# Patient Record
Sex: Female | Born: 1935 | Race: White | Hispanic: No
Health system: Southern US, Community
[De-identification: ages and names within clinical notes are randomized; demographics above are authoritative.]

## PROBLEM LIST (undated history)

## (undated) DIAGNOSIS — I4891 Unspecified atrial fibrillation: Secondary | ICD-10-CM

## (undated) DIAGNOSIS — I10 Essential (primary) hypertension: Secondary | ICD-10-CM

## (undated) DIAGNOSIS — G459 Transient cerebral ischemic attack, unspecified: Secondary | ICD-10-CM

## (undated) HISTORY — PX: ABDOMINAL HYSTERECTOMY: SHX81

## (undated) HISTORY — PX: APPENDECTOMY: SHX54

---

## 2015-09-03 ENCOUNTER — Observation Stay (HOSPITAL_COMMUNITY)
Admission: EM | Admit: 2015-09-03 | Discharge: 2015-09-05 | Disposition: A | Discharge status: Home / Self Care | Payer: Medicare Other | Attending: Internal Medicine | Admitting: Internal Medicine

## 2015-09-03 ENCOUNTER — Encounter (HOSPITAL_COMMUNITY): Payer: Self-pay

## 2015-09-03 ENCOUNTER — Inpatient Hospital Stay (HOSPITAL_COMMUNITY): Payer: Medicare Other

## 2015-09-03 ENCOUNTER — Observation Stay (HOSPITAL_COMMUNITY): Payer: Medicare Other

## 2015-09-03 ENCOUNTER — Emergency Department (HOSPITAL_COMMUNITY): Payer: Medicare Other

## 2015-09-03 DIAGNOSIS — H538 Other visual disturbances: Secondary | ICD-10-CM | POA: Insufficient documentation

## 2015-09-03 DIAGNOSIS — R410 Disorientation, unspecified: Secondary | ICD-10-CM | POA: Insufficient documentation

## 2015-09-03 DIAGNOSIS — I10 Essential (primary) hypertension: Secondary | ICD-10-CM | POA: Diagnosis present

## 2015-09-03 DIAGNOSIS — F40240 Claustrophobia: Secondary | ICD-10-CM | POA: Insufficient documentation

## 2015-09-03 DIAGNOSIS — I4891 Unspecified atrial fibrillation: Secondary | ICD-10-CM | POA: Diagnosis present

## 2015-09-03 DIAGNOSIS — Z7901 Long term (current) use of anticoagulants: Secondary | ICD-10-CM | POA: Insufficient documentation

## 2015-09-03 DIAGNOSIS — G459 Transient cerebral ischemic attack, unspecified: Principal | ICD-10-CM | POA: Diagnosis present

## 2015-09-03 DIAGNOSIS — E039 Hypothyroidism, unspecified: Secondary | ICD-10-CM | POA: Diagnosis present

## 2015-09-03 DIAGNOSIS — E785 Hyperlipidemia, unspecified: Secondary | ICD-10-CM | POA: Insufficient documentation

## 2015-09-03 DIAGNOSIS — Z7982 Long term (current) use of aspirin: Secondary | ICD-10-CM | POA: Insufficient documentation

## 2015-09-03 DIAGNOSIS — G451 Carotid artery syndrome (hemispheric): Secondary | ICD-10-CM | POA: Diagnosis not present

## 2015-09-03 HISTORY — DX: Unspecified atrial fibrillation: I48.91

## 2015-09-03 LAB — URINALYSIS, ROUTINE W REFLEX MICROSCOPIC
BILIRUBIN URINE: NEGATIVE
Glucose, UA: NEGATIVE mg/dL
Hgb urine dipstick: NEGATIVE
KETONES UR: 15 mg/dL — AB
NITRITE: NEGATIVE
PH: 6.5 (ref 5.0–8.0)
Protein, ur: NEGATIVE mg/dL
Specific Gravity, Urine: 1.009 (ref 1.005–1.030)
UROBILINOGEN UA: 0.2 mg/dL (ref 0.0–1.0)

## 2015-09-03 LAB — CBC WITH DIFFERENTIAL/PLATELET
BASOS ABS: 0 10*3/uL (ref 0.0–0.1)
Basophils Relative: 0 %
Eosinophils Absolute: 0.1 10*3/uL (ref 0.0–0.7)
Eosinophils Relative: 1 %
HEMATOCRIT: 41.2 % (ref 36.0–46.0)
Hemoglobin: 13.9 g/dL (ref 12.0–15.0)
LYMPHS ABS: 2.3 10*3/uL (ref 0.7–4.0)
LYMPHS PCT: 27 %
MCH: 32.4 pg (ref 26.0–34.0)
MCHC: 33.7 g/dL (ref 30.0–36.0)
MCV: 96 fL (ref 78.0–100.0)
MONO ABS: 0.8 10*3/uL (ref 0.1–1.0)
Monocytes Relative: 9 %
NEUTROS ABS: 5.3 10*3/uL (ref 1.7–7.7)
Neutrophils Relative %: 63 %
Platelets: 289 10*3/uL (ref 150–400)
RBC: 4.29 MIL/uL (ref 3.87–5.11)
RDW: 12.6 % (ref 11.5–15.5)
WBC: 8.5 10*3/uL (ref 4.0–10.5)

## 2015-09-03 LAB — RAPID URINE DRUG SCREEN, HOSP PERFORMED
Amphetamines: NOT DETECTED
BARBITURATES: NOT DETECTED
Benzodiazepines: NOT DETECTED
COCAINE: NOT DETECTED
OPIATES: NOT DETECTED
Tetrahydrocannabinol: NOT DETECTED

## 2015-09-03 LAB — BASIC METABOLIC PANEL
ANION GAP: 9 (ref 5–15)
BUN: 10 mg/dL (ref 6–20)
CO2: 25 mmol/L (ref 22–32)
Calcium: 9.7 mg/dL (ref 8.9–10.3)
Chloride: 104 mmol/L (ref 101–111)
Creatinine, Ser: 0.7 mg/dL (ref 0.44–1.00)
GFR calc Af Amer: >60 mL/min (ref >60)
GFR calc non Af Amer: >60 mL/min (ref >60)
GLUCOSE: 103 mg/dL — AB (ref 65–99)
POTASSIUM: 3.7 mmol/L (ref 3.5–5.1)
Sodium: 138 mmol/L (ref 135–145)

## 2015-09-03 LAB — APTT: APTT: 30 s (ref 24–37)

## 2015-09-03 LAB — PROTIME-INR
INR: 1.03 (ref 0.00–1.49)
Prothrombin Time: 13.7 seconds (ref 11.6–15.2)

## 2015-09-03 LAB — URINE MICROSCOPIC-ADD ON

## 2015-09-03 LAB — DIGOXIN LEVEL: DIGOXIN LVL: 0.7 ng/mL — AB (ref 0.8–2.0)

## 2015-09-03 MED ORDER — MAGNESIUM SULFATE 2 GM/50ML IV SOLN
2.0000 g | Freq: Once | INTRAVENOUS | Status: AC
  Administered 2015-09-04: 2 g via INTRAVENOUS
  Filled 2015-09-03: qty 50

## 2015-09-03 MED ORDER — ZOLPIDEM TARTRATE 5 MG PO TABS
2.5000 mg | ORAL_TABLET | Freq: Every day | ORAL | Status: DC
  Administered 2015-09-04 (×2): 2.5 mg via ORAL
  Filled 2015-09-03 (×2): qty 1

## 2015-09-03 MED ORDER — RIVAROXABAN 15 MG PO TABS
15.0000 mg | ORAL_TABLET | Freq: Every day | ORAL | Status: DC
  Administered 2015-09-03: 15 mg via ORAL
  Filled 2015-09-03 (×2): qty 1

## 2015-09-03 MED ORDER — GLUCOSAMINE-CHONDROITIN 500-400 MG PO TABS: 1.0000 | ORAL_TABLET | Freq: Three times a day (TID) | ORAL | Status: DC

## 2015-09-03 MED ORDER — OMEGA-3-ACID ETHYL ESTERS 1 G PO CAPS
1.0000 g | ORAL_CAPSULE | Freq: Two times a day (BID) | ORAL | Status: DC
  Administered 2015-09-04 – 2015-09-05 (×3): 1 g via ORAL
  Filled 2015-09-03 (×3): qty 1

## 2015-09-03 MED ORDER — IPRATROPIUM BROMIDE 0.06 % NA SOLN
2.0000 | Freq: Two times a day (BID) | NASAL | Status: DC
  Administered 2015-09-04 (×2): 2 via NASAL
  Filled 2015-09-03: qty 15

## 2015-09-03 MED ORDER — STROKE: EARLY STAGES OF RECOVERY BOOK
Freq: Once | Status: AC
  Administered 2015-09-04

## 2015-09-03 MED ORDER — POTASSIUM CHLORIDE CRYS ER 20 MEQ PO TBCR
20.0000 meq | EXTENDED_RELEASE_TABLET | Freq: Once | ORAL | Status: AC
  Administered 2015-09-04: 20 meq via ORAL
  Filled 2015-09-03: qty 1

## 2015-09-03 MED ORDER — LORAZEPAM 2 MG/ML IJ SOLN
1.0000 mg | Freq: Once | INTRAMUSCULAR | Status: DC
  Filled 2015-09-03: qty 1

## 2015-09-03 MED ORDER — DIGOXIN 125 MCG PO TABS
0.1250 mg | ORAL_TABLET | Freq: Every day | ORAL | Status: DC
  Administered 2015-09-04 – 2015-09-05 (×2): 0.125 mg via ORAL
  Filled 2015-09-03 (×2): qty 1

## 2015-09-03 MED ORDER — METOPROLOL TARTRATE 25 MG PO TABS
37.5000 mg | ORAL_TABLET | Freq: Two times a day (BID) | ORAL | Status: DC
  Administered 2015-09-04 – 2015-09-05 (×3): 37.5 mg via ORAL
  Filled 2015-09-03 (×6): qty 1

## 2015-09-03 MED ORDER — METOPROLOL TARTRATE 25 MG PO TABS
37.5000 mg | ORAL_TABLET | Freq: Once | ORAL | Status: AC
  Administered 2015-09-03: 37.5 mg via ORAL
  Filled 2015-09-03: qty 2

## 2015-09-03 MED ORDER — LEVOTHYROXINE SODIUM 50 MCG PO TABS
75.0000 ug | ORAL_TABLET | Freq: Every day | ORAL | Status: DC
  Administered 2015-09-04 – 2015-09-05 (×2): 75 ug via ORAL
  Filled 2015-09-03 (×6): qty 1

## 2015-09-03 NOTE — Consult Note (Addendum)
Referring Physician: ED    Chief Complaint: transient visual disturbances, confusion, dysphasia  HPI:                                                                                                                                         Gabriella Shaw is an 81 y.o. female, right handed, very functional individual who plays gold regularly, with a past medical history that is significant for HTN, chronic atrial fibrillation, brought in by EMS for evaluation of the aforementioned symptoms. Patient lives at an assisted living facility and said that she never had similar symptoms before. Stated that before lunch time she was reading and suddenly her vision became so blurry that she couldn't read words, "it was like words on the pages weren't there or the pages were blank ".  Gabriella Shaw indicated that she then went to the clinic at [facility] where she reports she was confuse, unable to name the president or recall her son's name, and was unable to get her words out. The whole episode lasted for approximately one hour and resolved. No associated HA, vertigo, double vision, focal weakness or numbness. CT brain performed in the ED was personally reviewed and showed no acute abnormality. A fib documented in the ED. Stated that she has been taking aspirin 81 mg daily. Of note, she reports an episode of transient painless visual loss left eye approximately 5 or 6 years ago.   Date last known well: 09/03/15 Time last known well: noon time  tPA Given: no, symptoms resolved   Past Medical History  Diagnosis Date  . A-fib     Past Surgical History  Procedure Laterality Date  . Abdominal hysterectomy    . Appendectomy      No family history on file. Social History:  does not have a smoking history on file. She has quit using smokeless tobacco. She reports that she drinks alcohol. She reports that she does not use illicit drugs. Family history: no MS, epilepsy, brain tumor, or brain  aneurysm. Allergies: No Known Allergies  Medications:                                                                                                                           I have reviewed the patient's current medications.  ROS:  History obtained from chart review and the patient  General ROS: negative for - chills, fatigue, fever, night sweats, weight gain or weight loss Psychological ROS: negative for - behavioral disorder, hallucinations, memory difficulties, mood swings or suicidal ideation Ophthalmic ROS: negative for - blurry vision, double vision, eye pain or loss of vision ENT ROS: negative for - epistaxis, nasal discharge, oral lesions, sore throat, tinnitus or vertigo Allergy and Immunology ROS: negative for - hives or itchy/watery eyes Hematological and Lymphatic ROS: negative for - bleeding problems, bruising or swollen lymph nodes Endocrine ROS: negative for - galactorrhea, hair pattern changes, polydipsia/polyuria or temperature intolerance Respiratory ROS: negative for - cough, hemoptysis, shortness of breath or wheezing Cardiovascular ROS: negative for - chest pain, dyspnea on exertion, or edema Gastrointestinal ROS: negative for - abdominal pain, diarrhea, hematemesis, nausea/vomiting or stool incontinence Genito-Urinary ROS: negative for - dysuria, hematuria, incontinence or urinary frequency/urgency Musculoskeletal ROS: negative for - joint swelling or muscular weakness Neurological ROS: as noted in HPI Dermatological ROS: negative for rash and skin lesion changes   Physical exam:  Constitutional: well developed, pleasant female in no apparent distress. Blood pressure 153/69, pulse 61, temperature 97.8 F (36.6 C), temperature source Oral, resp. rate 25, height 5' 6.5" (1.689 m), weight 58.968 kg (130 lb), SpO2 96 %. Eyes: no jaundice  or exophthalmos.  Head: normocephalic. Neck: supple, no bruits, no JVD. Cardiac: no murmurs. Lungs: clear. Abdomen: soft, no tender, no mass. Extremities: no edema, clubbing, or cyanosis.  Skin: no rash  Neurologic Examination:                                                                                                      General: Mental Status: Alert, oriented, thought content appropriate.  Speech fluent without evidence of aphasia.  Able to follow 3 step commands without difficulty. Cranial Nerves: II: Discs flat bilaterally; Visual fields grossly normal, pupils equal, round, reactive to light and accommodation III,IV, VI: ptosis not present, extra-ocular motions intact bilaterally V,VII: smile symmetric, facial light touch sensation normal bilaterally VIII: hearing normal bilaterally IX,X: uvula rises symmetrically XI: bilateral shoulder shrug XII: midline tongue extension without atrophy or fasciculations  Motor: Right : Upper extremity   5/5    Left:     Upper extremity   5/5  Lower extremity   5/5     Lower extremity   5/5 Tone and bulk:normal tone throughout; no atrophy noted Sensory: Pinprick and light touch intact throughout, bilaterally Deep Tendon Reflexes:  1+ all over Plantars: Right: downgoing   Left: downgoing Cerebellar: normal finger-to-nose,  normal heel-to-shin test Gait:  No tested due to multiple leads    Results for orders placed or performed during the hospital encounter of 09/03/15 (from the past 48 hour(s))  Urinalysis, Routine w reflex microscopic (not at [facility] [address]     Status: Abnormal   Collection Time: 09/03/15  3:00 PM  Result Value Ref Range   Color, Urine YELLOW YELLOW   APPearance CLOUDY (A) CLEAR   Specific Gravity, Urine 1.009 1.005 - 1.030   pH 6.5 5.0 -  8.0   Glucose, UA NEGATIVE NEGATIVE mg/dL   Hgb urine dipstick NEGATIVE NEGATIVE   Bilirubin Urine NEGATIVE NEGATIVE   Ketones, ur 15 (A) NEGATIVE mg/dL   Protein, ur NEGATIVE  NEGATIVE mg/dL   Urobilinogen, UA 0.2 0.0 - 1.0 mg/dL   Nitrite NEGATIVE NEGATIVE   Leukocytes, UA TRACE (A) NEGATIVE  Urine rapid drug screen (hosp performed)not at [facility]     Status: None   Collection Time: 09/03/15  3:00 PM  Result Value Ref Range   Opiates NONE DETECTED NONE DETECTED   Cocaine NONE DETECTED NONE DETECTED   Benzodiazepines NONE DETECTED NONE DETECTED   Amphetamines NONE DETECTED NONE DETECTED   Tetrahydrocannabinol NONE DETECTED NONE DETECTED   Barbiturates NONE DETECTED NONE DETECTED    Comment:        DRUG SCREEN FOR MEDICAL PURPOSES ONLY.  IF CONFIRMATION IS NEEDED FOR ANY PURPOSE, NOTIFY LAB WITHIN 5 DAYS.        LOWEST DETECTABLE LIMITS FOR URINE DRUG SCREEN Drug Class       Cutoff (ng/mL) Amphetamine      1000 Barbiturate      200 Benzodiazepine   191 Tricyclics       478 Opiates          300 Cocaine          300 THC              50   Urine microscopic-add on     Status: None   Collection Time: 09/03/15  3:00 PM  Result Value Ref Range   Squamous Epithelial / LPF RARE RARE   WBC, UA 0-2 <3 WBC/hpf   RBC / HPF 0-2 <3 RBC/hpf  CBC with Differential     Status: None   Collection Time: 09/03/15  4:43 PM  Result Value Ref Range   WBC 8.5 4.0 - 10.5 K/uL   RBC 4.29 3.87 - 5.11 MIL/uL   Hemoglobin 13.9 12.0 - 15.0 g/dL   HCT 41.2 36.0 - 46.0 %   MCV 96.0 78.0 - 100.0 fL   MCH 32.4 26.0 - 34.0 pg   MCHC 33.7 30.0 - 36.0 g/dL   RDW 12.6 11.5 - 15.5 %   Platelets 289 150 - 400 K/uL   Neutrophils Relative % 63 %   Neutro Abs 5.3 1.7 - 7.7 K/uL   Lymphocytes Relative 27 %   Lymphs Abs 2.3 0.7 - 4.0 K/uL   Monocytes Relative 9 %   Monocytes Absolute 0.8 0.1 - 1.0 K/uL   Eosinophils Relative 1 %   Eosinophils Absolute 0.1 0.0 - 0.7 K/uL   Basophils Relative 0 %   Basophils Absolute 0.0 0.0 - 0.1 K/uL  Basic metabolic panel     Status: Abnormal   Collection Time: 09/03/15  4:43 PM  Result Value Ref Range   Sodium 138 135 - 145 mmol/L   Potassium  3.7 3.5 - 5.1 mmol/L   Chloride 104 101 - 111 mmol/L   CO2 25 22 - 32 mmol/L   Glucose, Bld 103 (H) 65 - 99 mg/dL   BUN 10 6 - 20 mg/dL   Creatinine, Ser 0.70 0.44 - 1.00 mg/dL   Calcium 9.7 8.9 - 10.3 mg/dL   GFR calc non Af Amer >60 >60 mL/min   GFR calc Af Amer >60 >60 mL/min    Comment: (NOTE) The eGFR has been calculated using the CKD EPI equation. This calculation has not been validated in all clinical situations. eGFR's persistently <  60 mL/min signify possible Chronic Kidney Disease.    Anion gap 9 5 - 15  Protime-INR     Status: None   Collection Time: 09/03/15  4:43 PM  Result Value Ref Range   Prothrombin Time 13.7 11.6 - 15.2 seconds   INR 1.03 0.00 - 1.49  APTT     Status: None   Collection Time: 09/03/15  4:43 PM  Result Value Ref Range   aPTT 30 24 - 37 seconds  Digoxin level     Status: Abnormal   Collection Time: 09/03/15  4:43 PM  Result Value Ref Range   Digoxin Level 0.7 (L) 0.8 - 2.0 ng/mL   Ct Head Wo Contrast  09/03/2015   CLINICAL DATA:  Acute dizziness and blurred vision today.  EXAM: CT HEAD WITHOUT CONTRAST  TECHNIQUE: Contiguous axial images were obtained from the base of the skull through the vertex without intravenous contrast.  COMPARISON:  None.  FINDINGS: Mild generalized cerebral volume loss is noted.  No acute intracranial abnormalities are identified, including mass lesion or mass effect, hydrocephalus, extra-axial fluid collection, midline shift, hemorrhage, or acute infarction.  The visualized bony calvarium is unremarkable.  IMPRESSION: No evidence of acute intracranial abnormality.  Mild atrophy.   Electronically Signed   By: Margarette Canada M.D.   On: 09/03/2015 18:31    Assessment: 81 y.o. female with HTN, atrial fibrillation, comes in a constellation of symptoms suggestive of TIA. ABCD2 score 5. Current CHA2DS-VASc score is now 6, which confers a yearly stroke risk of 9.8%. Patient is very functional and quite frankly seems to be a suitable  candidate for starting a NOAC. Recommend stopping aspirin. Complete TIA work up. Stroke team will follow up tomorrow.  Stroke Risk Factors - age, HTN, atrial fibrillation  Plan: 1. HgbA1c, fasting lipid panel 2. MRI, MRA  of the brain without contrast 3. Echocardiogram 4. Carotid dopplers 5. Prophylactic therapy-xarelto as per pharmacy 6. Risk factor modification 7. Telemetry monitoring 8. Frequent neuro checks 9. PT/OT SLP  Dorian Pod ,MD [facility] [phone]  09/03/2015, 7:54 PM

## 2015-09-03 NOTE — ED Notes (Signed)
Admitting at bedside 

## 2015-09-03 NOTE — Progress Notes (Signed)
ANTICOAGULATION CONSULT NOTE - Initial Consult  Pharmacy Consult for Xarelto Indication: Afib  No Known Allergies  Patient Measurements: Height: 5' 6.5" (168.9 cm) Weight: 130 lb (58.968 kg) IBW/kg (Calculated) : 60.45  Vital Signs: Temp: 97.8 F (36.6 C) (09/19 1440) Temp Source: Oral (09/19 1440) BP: 144/57 mmHg (09/19 2000) Pulse Rate: 63 (09/19 2000)  Labs:  Recent Labs  09/03/15 1643  HGB 13.9  HCT 41.2  PLT 289  APTT 30  LABPROT 13.7  INR 1.03  CREATININE 0.70    Estimated Creatinine Clearance: 47 mL/min (by C-G formula based on Cr of 0.7).   Medical History: Past Medical History  Diagnosis Date  . A-fib      Assessment: 81 yo F presents on 9/19 with transient visual disturbances, confusion, and dysphasia. Pharmacy consulted to dose new Xarelto for Afib. Head CT shows no acute abnormalities. Current CHA2DS-VASc score is now 6. Was only taking aspirin at home before this admit. CBC stable, no s/s of bleed. SCr stable, CrCl ~92ml/min.  Goal of Therapy:  Monitor platelets by anticoagulation protocol: Yes   Plan:  Start Xarelto  PO daily with supper Monitor CBC, s/s of bleed   BATCHELDER,NATHAN J 09/03/2015,8:29 PM

## 2015-09-03 NOTE — Progress Notes (Signed)
PHARMACIST - PHYSICIAN ORDER COMMUNICATION  CONCERNING: P&T Medication Policy on Herbal Medications  DESCRIPTION:  This patient's order for:  Glucosamine-chondroitin  has been noted.  This product(s) is classified as an "herbal" or natural product. Due to a lack of definitive safety studies or FDA approval, nonstandard manufacturing practices, plus the potential risk of unknown drug-drug interactions while on inpatient medications, the Pharmacy and Therapeutics Committee does not permit the use of "herbal" or natural products of this type within .   ACTION TAKEN: The pharmacy department is unable to verify this order at this time and your patient has been informed of this safety policy. Please reevaluate patient's clinical condition at discharge and address if the herbal or natural product(s) should be resumed at that time.   

## 2015-09-03 NOTE — ED Notes (Signed)
This RN went to MRI to administer Ativan and attempt to calm pt down for her scan. Pt was very upset and crying and stated she did not wish to have an MRI or take an medicine.

## 2015-09-03 NOTE — Progress Notes (Signed)
Pt arrived to unit via stretcher from the ED.  Vitals and assessment stable, see flowsheet. Tele applied and central monitoring notified. Pt oriented to unit, staff and plan of care.  Will continue to monitor.

## 2015-09-03 NOTE — H&P (Signed)
[facility] History and Physical  Kristia Jupiter ZOX:[id number] DOB: [birth year] DOA: 09/03/2015  Referring physician: Cheri Fowler, PA-C PCP: No primary care [facility] on file.   Chief Complaint: blurred vision and confusion   HPI: Gabriella Shaw is a 81 y.o. female with a past medical atrial fibrillation, hypertension, hypothyroidism who comes to the emergency department due to having blurred vision, followed by inability to read the book, difficulty finding words and confusion. She states that around 11:30 in the morning she is trying to read a book and noticed that she was unable to read the words, so she tried to read and other book and noticed the same thing again. She then went to see the health care personnel at her facility and there she was noticed to have confusion, so EMS was called and the patient was brought to the emergency department. However, since then the symptoms have completely resolved. She is currently in no acute distress.   Review of Systems:  Constitutional:  No weight loss, night sweats, Fevers, chills, fatigue.  HEENT:  Positive post nasal drip and frequent nasal congestion, ,  No headaches, Difficulty swallowing,Tooth/dental problems,Sore throat,  No sneezing, itching, ear ache,  Cardio-vascular:  No chest pain, Orthopnea, PND, swelling in lower extremities, anasarca, dizziness, palpitations  GI:  No heartburn, indigestion, abdominal pain, nausea, vomiting, diarrhea, change in bowel habits, loss of appetite  Resp:  No shortness of breath with exertion or at rest. No excess mucus, no productive cough, No non-productive cough, No coughing up of blood.No change in color of mucus.No wheezing.No chest wall deformity  Skin:  no rash or lesions.  GU:  no dysuria, change in color of urine, no urgency or frequency. No flank pain.  Musculoskeletal:  No joint pain or swelling. No decreased range of motion. No back pain.  Psych:  No change in mood or affect.  No depression or anxiety. No memory loss.  Neuro: As above mentioned.  Past Medical History  Diagnosis Date  . A-fib    Past Surgical History  Procedure Laterality Date  . Abdominal hysterectomy    . Appendectomy     Social History:  does not have a smoking history on file. She has quit using smokeless tobacco. She reports that she drinks alcohol. She reports that she does not use illicit drugs.  No Known Allergies  History reviewed. No pertinent family history.    Prior to Admission medications   Medication Sig Start Date End Date Taking? Authorizing Storey Stangeland  Ascorbic Acid (VITAMIN C PO) Take 1 tablet by mouth daily.   Yes Historical Karalynn Cottone, MD  aspirin 81 MG tablet Take 81 mg by mouth daily.   Yes Historical Zayan Delvecchio, MD  Cholecalciferol (VITAMIN D PO) Take by mouth.   Yes Historical Tashonna Descoteaux, MD  Cyanocobalamin (VITAMIN B 12 PO) Take 1 tablet by mouth daily.   Yes Historical Momina Hunton, MD  digoxin (LANOXIN) 0.125 MG tablet Take 0.125 mg by mouth daily.   Yes Historical Sebastian Lurz, MD  glucosamine-chondroitin 500-400 MG tablet Take 1 tablet by mouth 3 (three) times daily.   Yes Historical Sharyn Brilliant, MD  ipratropium (ATROVENT) 0.03 % nasal spray Place 2 sprays into both nostrils every 12 (twelve) hours.   Yes Historical Tyreese Thain, MD  levothyroxine (SYNTHROID, LEVOTHROID) 75 MCG tablet Take 75 mcg by mouth daily before breakfast.   Yes Historical Debar Plate, MD  metoprolol tartrate (LOPRESSOR) 25 MG tablet Take 37.5 mg by mouth 2 (two) times daily.    Yes Historical  Muath Hallam, MD  omega-3 acid ethyl esters (LOVAZA) 1 G capsule Take by mouth 2 (two) times daily.   Yes Historical Donnivan Villena, MD  VITAMIN A PO Take 1 tablet by mouth daily.   Yes Historical Shaivi Rothschild, MD  VITAMIN E PO Take 1 tablet by mouth daily.   Yes Historical Josiane Labine, MD  zolpidem (AMBIEN) 5 MG tablet Take 2.5 mg by mouth at bedtime.   Yes Historical Copeland Neisen, MD   Physical Exam: Filed Vitals:   09/03/15 2030 09/03/15  2221 09/03/15 2222 09/03/15 2237  BP: 154/60 174/64  174/66  Pulse: 62  56 56  Temp:    97.5 F (36.4 C)  TempSrc:    Oral  Resp: Height:     (1.676 m)  Weight:    55.702 kg (122 lb 12.8 oz)  SpO2: 98%  93% 96%    Wt Readings from Last 3 Encounters:  09/03/15 55.702 kg (122 lb 12.8 oz)    General:  Appears calm and comfortable Eyes: PERRL, normal lids, irises & conjunctiva ENT: grossly normal hearing, lips & tongue Neck: no LAD, masses or thyromegaly Cardiovascular: RRR, no m/r/g. No LE edema. Telemetry: SR, no arrhythmias  Respiratory: CTA bilaterally, no w/r/r. Normal respiratory effort. Abdomen: soft, ntnd Skin: no rash or induration seen on limited exam Musculoskeletal: grossly normal tone BUE/BLE Psychiatric: grossly normal mood and affect, speech fluent and appropriate Neurologic: grossly non-focal.          Labs on Admission:  Basic Metabolic Panel:  Recent Labs Lab 09/03/15 1643  NA 138  K 3.7  CL 104  CO2 25  GLUCOSE 103*  BUN 10  CREATININE 0.70  CALCIUM 9.7   CBC:  Recent Labs Lab 09/03/15 1643  WBC 8.5  NEUTROABS 5.3  HGB 13.9  HCT 41.2  MCV 96.0  PLT 289    Radiological Exams on Admission: Ct Head Wo Contrast  09/03/2015   CLINICAL DATA:  Acute dizziness and blurred vision today.  EXAM: CT HEAD WITHOUT CONTRAST  TECHNIQUE: Contiguous axial images were obtained from the base of the skull through the vertex without intravenous contrast.  COMPARISON:  None.  FINDINGS: Mild generalized cerebral volume loss is noted.  No acute intracranial abnormalities are identified, including mass lesion or mass effect, hydrocephalus, extra-axial fluid collection, midline shift, hemorrhage, or acute infarction.  The visualized bony calvarium is unremarkable.  IMPRESSION: No evidence of acute intracranial abnormality.  Mild atrophy.   Electronically Signed   By: Harmon Pier M.D.   On: 09/03/2015 18:31     Assessment/Plan Principal Problem:      TIA (transient ischemic attack) The patient was unable to undergo the MRI/MRA of the brain due to claustrophobia in the MRI scanner. I offered her to have sedation, but she declined and stated that she does not want to have this done. She agreed to have the rest of the workup. Check echocardiogram, carotid Doppler, lipid panel and hemoglobin A1c.  Active Problems:     Atrial fibrillation Telemetry monitoring. Continue metoprolol and digoxin. Patient has occasional PVCs on the cardiac monitor. I will optimize potassium and magnesium..      Essential hypertension Continue current antihypertensive therapy and monitor blood pressure.      Hypothyroidism  Continue levothyroxine.   Dr. Wyatt Portela from the neuro hospitalist service was consulted by the emergency department.   Code Status: Full code. DVT Prophylaxis: On chronic anticoagulation therapy. Family Communication:  Disposition Plan: Admit to telemetry  monitoring for TIA/stroke workup.  Time spent: Over 70 minutes were spent during the process of this admission.  Bobette Mo [facility] Pager [phone].

## 2015-09-03 NOTE — ED Notes (Signed)
Pt arrived per EMS, approximately 12:30p pt was reading and her vision became blurry and she had a hard time understanding. She appeared confused not typical of her baseline at the assisted living facility, and EMS was called. EMS reports oriented X4, Afib on monitor without any elevation, BS 106, BP 155/70, 97% RA, w/o any neuro deficits.

## 2015-09-03 NOTE — ED Provider Notes (Signed)
CSN: [id number]     Arrival date & time 09/03/15  1432 History   First MD Initiated Contact with Patient 09/03/15 1558     No chief complaint on file.    (Consider location/radiation/quality/duration/timing/severity/associated sxs/prior Treatment) HPI   Gabriella Shaw is a 81 y.o. female with PMH significant for atrial fibrillation and HTN who presents with resolved visual disturbance and dysphasia at 12:30 PM today.  She resides at [facility].She states she was reading before lunch when she began having b/l visual disturbances (R eye>L eye).  She states that words on the pages weren't there or blank.  She then went to the clinic at [facility] where she reports she was unable to name the president or recall her son's name.  She felt she was confused and had some slurred speech.  She states all of her symptoms have resolved.  Her son is in the room and states she is at her baseline now.  She is on digoxin and takes ASA 81 mg QD. Denies facial droop, weakness, sensory changes, dysphagia, HA, CP, SOB, abdominal pain, N/V/D.   Past Medical History  Diagnosis Date  . A-fib    Past Surgical History  Procedure Laterality Date  . Abdominal hysterectomy    . Appendectomy     No family history on file. Social History  Substance Use Topics  . Smoking status: Not on file  . Smokeless tobacco: Former Neurosurgeon  . Alcohol Use: Yes     Comment: drinks 3-4 oz of scotch daily   OB History    No data available     Review of Systems All other systems negative unless otherwise stated in HPI   Allergies  Review of patient's allergies indicates no known allergies.  Home Medications   Prior to Admission medications   Medication Sig Start Date End Date Taking? Authorizing Provider  Ascorbic Acid (VITAMIN C PO) Take 1 tablet by mouth daily.   Yes Historical Provider, MD  aspirin 81 MG tablet Take 81 mg by mouth daily.   Yes Historical Provider, MD  Cholecalciferol (VITAMIN D PO) Take by  mouth.   Yes Historical Provider, MD  Cyanocobalamin (VITAMIN B 12 PO) Take 1 tablet by mouth daily.   Yes Historical Provider, MD  digoxin (LANOXIN) 0.125 MG tablet Take 0.125 mg by mouth daily.   Yes Historical Provider, MD  glucosamine-chondroitin 500-400 MG tablet Take 1 tablet by mouth 3 (three) times daily.   Yes Historical Provider, MD  ipratropium (ATROVENT) 0.03 % nasal spray Place 2 sprays into both nostrils every 12 (twelve) hours.   Yes Historical Provider, MD  levothyroxine (SYNTHROID, LEVOTHROID) 75 MCG tablet Take 75 mcg by mouth daily before breakfast.   Yes Historical Provider, MD  metoprolol tartrate (LOPRESSOR) 25 MG tablet Take 37.5 mg by mouth 2 (two) times daily.    Yes Historical Provider, MD  omega-3 acid ethyl esters (LOVAZA) 1 G capsule Take by mouth 2 (two) times daily.   Yes Historical Provider, MD  VITAMIN A PO Take 1 tablet by mouth daily.   Yes Historical Provider, MD  VITAMIN E PO Take 1 tablet by mouth daily.   Yes Historical Provider, MD  zolpidem (AMBIEN) 5 MG tablet Take 2.5 mg by mouth at bedtime.   Yes Historical Provider, MD   BP 153/69 mmHg  Pulse 61  Temp(Src) 97.8 F (36.6 C) (Oral)  Resp 25  Ht 5' 6.5" (1.689 m)  Wt 130 lb (58.968 kg)  BMI 20.67  kg/m2  SpO2 96% Physical Exam  Constitutional: She is oriented to person, place, and time. She appears well-developed and well-nourished.  HENT:  Head: Normocephalic and atraumatic.  Mouth/Throat: Oropharynx is clear and moist.  Eyes: EOM are normal. Pupils are equal, round, and reactive to light.  Neck: Normal range of motion. Neck supple.  Cardiovascular: Normal rate and regular rhythm.   No murmur heard. Pulmonary/Chest: Effort normal and breath sounds normal. No respiratory distress. She has no wheezes. She has no rales.  Abdominal: Soft. Bowel sounds are normal. She exhibits no distension. There is no tenderness.  Musculoskeletal: Normal range of motion.  Lymphadenopathy:    She has no cervical  adenopathy.  Neurological: She is alert and oriented to person, place, and time.  Mental Status:   AOx3.  Mentating appropriately without dysarthria. Cranial Nerves:  I-not tested  II-visual acuity grossly intact, PERRLA  III, IV, VI-EOMs intact  V-temporal and masseter strength intact  VII-symmetrical facial movements intact, no facial droop  VIII-hearing grossly intact bilaterally  IX, X-gag intact  XI-strength of sternomastoid and trapezius muscles 5/5  XII-tongue midline Motor:   Good muscle bulk and tone  Strength 5/5 bilaterally in upper and lower extremities   Cerebellar--RAMs, finger to nose intact  Romberg--maintains balance with eyes closed  Casual and tandem gait normal without ataxia  No pronator drift Sensory:   intact   Skin: Skin is warm and dry.  Psychiatric: She has a normal mood and affect. Her behavior is normal.    ED Course  Procedures (including critical care time) Labs Review Labs Reviewed  BASIC METABOLIC PANEL - Abnormal; Notable for the following:    Glucose, Bld 103 (*)    All other components within normal limits  DIGOXIN LEVEL - Abnormal; Notable for the following:    Digoxin Level 0.7 (*)    All other components within normal limits  URINALYSIS, ROUTINE W REFLEX MICROSCOPIC (NOT AT [facility]) - Abnormal; Notable for the following:    APPearance CLOUDY (*)    Ketones, ur 15 (*)    Leukocytes, UA TRACE (*)    All other components within normal limits  CBC WITH DIFFERENTIAL/PLATELET  PROTIME-INR  APTT  URINE RAPID DRUG SCREEN, HOSP PERFORMED  URINE MICROSCOPIC-ADD ON    Imaging Review Ct Head Wo Contrast  09/03/2015   CLINICAL DATA:  Acute dizziness and blurred vision today.  EXAM: CT HEAD WITHOUT CONTRAST  TECHNIQUE: Contiguous axial images were obtained from the base of the skull through the vertex without intravenous contrast.  COMPARISON:  None.  FINDINGS: Mild generalized cerebral volume loss is noted.  No acute intracranial abnormalities  are identified, including mass lesion or mass effect, hydrocephalus, extra-axial fluid collection, midline shift, hemorrhage, or acute infarction.  The visualized bony calvarium is unremarkable.  IMPRESSION: No evidence of acute intracranial abnormality.  Mild atrophy.   Electronically Signed   By: Harmon Pier M.D.   On: 09/03/2015 18:31   I have personally reviewed and evaluated these images and lab results as part of my medical decision-making.   EKG Interpretation None      MDM   Final diagnoses:  Transient cerebral ischemia, unspecified transient cerebral ischemia type    Patient presents with resolved visual disturbance and dysphasia .  VSS, patient appears nontoxic, NAD.  On exam, no focal neurological deficits.  No dysarthria, no ataxia.  Concern for TIA vs stroke.   Imaging include noncontrast head CT.  Labs include BMP, CBC, PT/PTT, troponin, UDS, and  UA.  Will consult neurology.  Likely admission. CBC, BMP, PT/PTT, digoxin level, UA all normal.  Head CT shows no evidence of acute intracranial abnormality.   7:10 PM: consult to neuro hospitalist. Will admit for TIA workup.  Patient acknowledges and agrees with the above plan.  Case has been discussed with and seen by Dr. Broadus John who agrees with the above plan for admission.         Cheri Fowler, PA-C 09/03/15 2009  Arby Barrette, MD 09/03/15 2144

## 2015-09-04 ENCOUNTER — Observation Stay (HOSPITAL_BASED_OUTPATIENT_CLINIC_OR_DEPARTMENT_OTHER): Payer: Medicare Other

## 2015-09-04 ENCOUNTER — Other Ambulatory Visit (HOSPITAL_COMMUNITY): Payer: Medicare Other

## 2015-09-04 DIAGNOSIS — E039 Hypothyroidism, unspecified: Secondary | ICD-10-CM | POA: Diagnosis not present

## 2015-09-04 DIAGNOSIS — I10 Essential (primary) hypertension: Secondary | ICD-10-CM

## 2015-09-04 DIAGNOSIS — I4891 Unspecified atrial fibrillation: Secondary | ICD-10-CM | POA: Diagnosis not present

## 2015-09-04 DIAGNOSIS — G459 Transient cerebral ischemic attack, unspecified: Secondary | ICD-10-CM | POA: Diagnosis not present

## 2015-09-04 DIAGNOSIS — G451 Carotid artery syndrome (hemispheric): Secondary | ICD-10-CM

## 2015-09-04 LAB — LIPID PANEL
CHOL/HDL RATIO: 4 ratio
CHOLESTEROL: 270 mg/dL — AB (ref 0–200)
HDL: 68 mg/dL (ref >40)
LDL Cholesterol: 176 mg/dL — ABNORMAL HIGH (ref 0–99)
Triglycerides: 128 mg/dL (ref <150)
VLDL: 26 mg/dL (ref 0–40)

## 2015-09-04 MED ORDER — ATORVASTATIN CALCIUM 40 MG PO TABS
40.0000 mg | ORAL_TABLET | Freq: Every day | ORAL | Status: DC
  Filled 2015-09-04: qty 1

## 2015-09-04 NOTE — Evaluation (Signed)
SLP Cancellation Note  Patient Details Name: Kent Braunschweig MRN: [id number] DOB: 07-02-1927   Cancelled treatment:       Reason Eval/Treat Not Completed: Patient at procedure or test/unavailable  Donavan Burnet, MS [facility] SLP [phone]

## 2015-09-04 NOTE — Progress Notes (Signed)
Preliminary results by tech - Carotid Duplex Completed. No evidence of stenosis in the right or left carotid arteries. Bilateral vertebral arteries demonstrate normal antegrade flow. Marilynne Halsted, BS, RDMS, RVT

## 2015-09-04 NOTE — Progress Notes (Signed)
PROGRESS NOTE  Gabriella Shaw ZOX:[id number] DOB: [birth year] DOA: 09/03/2015 PCP: No primary care provider on file.  Assessment/Plan: TIA (transient ischemic attack) - undergo the MRI/MRA of the brain due to claustrophobia in the MRI scanner:  Offered sedation, but she declined and stated that she does not want to have this done. She agreed to have the rest of the workup-- may need repeat CT Scan after 24 hours?0--- defer to neuro -echocardiogram, carotid Doppler, hemoglobin A1c. -add statin as LDL > 70 -PT/OT eval   Atrial fibrillation Telemetry Continue metoprolol and digoxin. Patient has occasional PVCs on the cardiac monitor. I will optimize potassium and magnesium. -only on ASA -[place] VASC2-- at least 6   Essential hypertension Continue current antihypertensive therapy and monitor blood pressure.   Hypothyroidism  Continue levothyroxine.   Code Status: full Family Communication: patient Disposition Plan: [place] [facility]   Consultants:    Procedures:      HPI/Subjective: No overnight events-- not able to tolerate MRI due to claustrophobia   Objective: Filed Vitals:   09/04/15 0735  BP: 123/62  Pulse: 59  Temp: 98.2 F (36.8 C)  Resp: 14   No intake or output data in the 24 hours ending 09/04/15 0852 Filed Weights   09/03/15 1439 09/03/15 1440 09/03/15 2237  Weight: 58.968 kg (130 lb) 58.968 kg (130 lb) 55.702 kg (122 lb 12.8 oz)    Exam:   General:  Awake, NAD- moves well in the bed  Cardiovascular: rrr  Respiratory: clear  Abdomen: +BS, soft   Data Reviewed: Basic Metabolic Panel:  Recent Labs Lab 09/03/15 1643  NA 138  K 3.7  CL 104  CO2 25  GLUCOSE 103*  BUN 10  CREATININE 0.70  CALCIUM 9.7   Liver Function Tests: No results for input(s): AST, ALT, ALKPHOS, BILITOT, PROT, ALBUMIN in the last 168 hours. No results for input(s): LIPASE, AMYLASE in the last 168 hours. No results for input(s): AMMONIA in the last  168 hours. CBC:  Recent Labs Lab 09/03/15 1643  WBC 8.5  NEUTROABS 5.3  HGB 13.9  HCT 41.2  MCV 96.0  PLT 289   Cardiac Enzymes: No results for input(s): CKTOTAL, CKMB, CKMBINDEX, TROPONINI in the last 168 hours. BNP (last 3 results) No results for input(s): BNP in the last 8760 hours.  ProBNP (last 3 results) No results for input(s): PROBNP in the last 8760 hours.  CBG: No results for input(s): GLUCAP in the last 168 hours.  No results found for this or any previous visit (from the past 240 hour(s)).   Studies: Ct Head Wo Contrast  09/03/2015   CLINICAL DATA:  Acute dizziness and blurred vision today.  EXAM: CT HEAD WITHOUT CONTRAST  TECHNIQUE: Contiguous axial images were obtained from the base of the skull through the vertex without intravenous contrast.  COMPARISON:  None.  FINDINGS: Mild generalized cerebral volume loss is noted.  No acute intracranial abnormalities are identified, including mass lesion or mass effect, hydrocephalus, extra-axial fluid collection, midline shift, hemorrhage, or acute infarction.  The visualized bony calvarium is unremarkable.  IMPRESSION: No evidence of acute intracranial abnormality.  Mild atrophy.   Electronically Signed   By: Harmon Pier M.D.   On: 09/03/2015 18:31    Scheduled Meds: . digoxin  0.125 mg Oral Daily  . ipratropium  2 spray Each Nare BID  . levothyroxine  75 mcg Oral QAC breakfast  . LORazepam  1 mg Intravenous Once  . metoprolol tartrate  37.5 mg  Oral BID  . omega-3 acid ethyl esters  1 g Oral BID  . rivaroxaban  15 mg Oral Q supper  . zolpidem  2.5 mg Oral QHS   Continuous Infusions:  Antibiotics Given (last 72 hours)    None      Principal Problem:   TIA (transient ischemic attack) Active Problems:   Atrial fibrillation   Essential hypertension   Hypothyroidism    Time spent: 25 min    VANN, JESSICA  [facility] Pager [phone]. If 7PM-7AM, please contact night-coverage at [email or web address],  password [facility] 09/04/2015, 8:52 AM  LOS: 1 day

## 2015-09-04 NOTE — Progress Notes (Signed)
OT Cancellation Note  Patient Details Name: Clydia Nieves MRN: [id number] DOB: 12/27/1926   Cancelled Treatment:    Reason Eval/Treat Not Completed: OT screened, no needs identified, will sign off.Pt reports she is at/near baseline with ADLs. Will sign off.   Pilar Grammes 09/04/2015, 2:07 PM

## 2015-09-04 NOTE — Progress Notes (Signed)
STROKE TEAM PROGRESS NOTE  HPI Gabriella Shaw is an 81 y.o. female, right handed, very functional individual who plays golf regularly, with a past medical history that is significant for HTN, chronic atrial fibrillation, brought in by EMS for evaluation of transient visual disturbances, confusion, and dysphasia. Patient lives at an assisted living facility and said that she never had similar symptoms before. Stated that before lunch time she was reading and suddenly her vision became so blurry that she couldn't read words, "it was like words on the pages weren't there or the pages were blank ".  Gabriella Shaw indicated that she then went to the clinic at [facility] where she reports she was confuse, unable to name the president or recall her son's name, and was unable to get her words out. The whole episode lasted for approximately one hour and resolved. No associated HA, vertigo, double vision, focal weakness or numbness. CT brain performed in the ED was personally reviewed and showed no acute abnormality. A fib documented in the ED. Stated that she has been taking aspirin 81 mg daily. Of note, she reports an episode of transient painless visual loss left eye approximately 5 or 6 years ago.   Date last known well: 09/03/15 Time last known well: noon time  tPA Given: no, symptoms resolved   SUBJECTIVE (INTERVAL HISTORY) The patient's son and daughter-in-law are at the bedside. Dr. Pearlean Brownie explained to patient may have had a TIA. She has a history of atrial fibrillation. He felt the patient might benefit from anticoagulation with Eliquis. This was discussed with the patient's family.   OBJECTIVE Temp:  [97.5 F (36.4 C)-98.4 F (36.9 C)] 98.4 F (36.9 C) (09/20 1550) Pulse Rate:  [55-65] 55 (09/20 1550) Cardiac Rhythm:  [-] Sinus bradycardia (09/20 0740) Resp:  [13-25] 16 (09/20 1550) BP: (123-174)/(50-72) 141/55 mmHg (09/20 1550) SpO2:  [93 %-98 %] 97 % (09/20 1550) Weight:  [55.702 kg  (122 lb 12.8 oz)] 55.702 kg (122 lb 12.8 oz) (09/19 2237)  CBC:   Recent Labs Lab 09/03/15 1643  WBC 8.5  NEUTROABS 5.3  HGB 13.9  HCT 41.2  MCV 96.0  PLT 289    Basic Metabolic Panel:   Recent Labs Lab 09/03/15 1643  NA 138  K 3.7  CL 104  CO2 25  GLUCOSE 103*  BUN 10  CREATININE 0.70  CALCIUM 9.7    Lipid Panel:     Component Value Date/Time   CHOL 270* 09/04/2015 0633   TRIG 128 09/04/2015 0633   HDL 68 09/04/2015 0633   CHOLHDL 4.0 09/04/2015 0633   VLDL 26 09/04/2015 0633   LDLCALC 176* 09/04/2015 0633   HgbA1c: No results found for: HGBA1C Urine Drug Screen:     Component Value Date/Time   LABOPIA NONE DETECTED 09/03/2015 1500   COCAINSCRNUR NONE DETECTED 09/03/2015 1500   LABBENZ NONE DETECTED 09/03/2015 1500   AMPHETMU NONE DETECTED 09/03/2015 1500   THCU NONE DETECTED 09/03/2015 1500   LABBARB NONE DETECTED 09/03/2015 1500      IMAGING  Ct Head Wo Contrast 09/03/2015    No evidence of acute intracranial abnormality.  Mild atrophy.      PHYSICAL EXAM Pleasant elderly lady currently not in distress. . Afebrile. Head is nontraumatic. Neck is supple without bruit.    Cardiac exam no murmur or gallop. Lungs are clear to auscultation. Distal pulses are well felt.  Neurological Exam :  Awake alert oriented 2. Speech and normal language appear normal. The  major tension, registration and recall. Extraocular movements are full range without nystagmus. Face is symmetric without weakness. Tongue is midline. Motor system exam reveals symmetric upper and lower extremity strength without focal weakness. The patellar reflexes are symmetric. Sensation is intact. Plantars are downgoing. Gait was not tested.   ASSESSMENT/PLAN Gabriella Shaw is a 81 y.o. female with history of atrial fibrillation, essential hypertension, and hypothyroidism presenting with transient visual disturbances, confusion, and dysphasia. She did not receive IV t-PA due to  resolution of deficits.   Possible TIA: Dominant  probably embolic secondary to atrial fibrillation without anticoagulation.  Resultant  resolution of deficits  MRI  not performed secondary to claustrophobia  MRA  not performed secondary to claustrophobia  Carotid Doppler  unremarkable  2D Echo  EF 60-65%. No cardiac source of emboli identified.  LDL 176  HgbA1c pending  VTE prophylaxis - Xarelto Diet Heart Room service appropriate?: Yes; Fluid consistency:: Thin  aspirin 81 mg orally every day prior to admission, now on xarelto ( rivaroxaban)  Patient counseled to be compliant with her antithrombotic medications  Ongoing aggressive stroke risk factor management  Therapy recommendations: No follow-up therapy recommended. Physical therapy evaluation pending.  Disposition: Pending  Hypertension  Stable   Hyperlipidemia  Home meds: No lipid lowering medications prior to admission  LDL 170, goal < 70  Will start Lipitor 40 mg daily  Continue statin at discharge    Other Stroke Risk Factors  Advanced age  ETOH use   Other Active Problems    Hospital day # 1  Delton See PA-C [facility] Pager [phone] 09/04/2015, 5:12 PM I have personally examined this patient, reviewed notes, independently viewed imaging studies, participated in medical decision making and plan of care. I have made any additions or clarifications directly to the above note. Agree with note above. She presented with transient vision disturbance, confusion and dysarthria likely from her left hemispheric TIA etiology likely cardioembolic from atrial fibrillation. She remains at risk for recurrent stroke, TIAs, neurological worsening and needs ongoing stroke evaluation and aggressive risk factor control. Recommend long-term anticoagulation with eliquis. Discussed with patient and daughter and answered questions  Delia Heady, MD Medical Director [facility] Pager: [phone] 09/04/2015 5:52 PM    To contact Stroke Continuity [place], please refer to [email or web address]. After hours, contact General Neurology

## 2015-09-04 NOTE — Discharge Summary (Signed)
Physical Therapy Discharge Patient Details Name: Estephany Perot MRN: [id number] DOB: 1927-05-14 Today's Date: 09/04/2015 Time: 4580-9983 PT Time Calculation (min) (ACUTE ONLY): 10 min  Patient discharged from PT services secondary to goals met and no further PT needs identified.  Please see latest therapy progress note for current level of functioning and progress toward goals.    Progress and discharge plan discussed with patient and/or caregiver: Patient/Caregiver agrees with plan  GP Functional Assessment Tool Used: clinical jusgement Functional Limitation: Mobility: Walking and moving around Mobility: Walking and Moving Around Current Status [phone]): 0 percent impaired, limited or restricted Mobility: Walking and Moving Around Goal Status (N3976): 0 percent impaired, limited or restricted Mobility: Walking and Moving Around Discharge Status [phone]): 0 percent impaired, limited or restricted   Lorita Officer 09/04/2015, 12:33 PM   Lorita Officer, SPT

## 2015-09-04 NOTE — Progress Notes (Signed)
  Echocardiogram 2D Echocardiogram has been performed.  Tye Savoy 09/04/2015, 11:01 AM

## 2015-09-04 NOTE — Evaluation (Signed)
Speech Language Pathology Evaluation Patient Details Name: Gabriella Shaw MRN: [id number] DOB: 1927/12/14 Today's Date: 09/04/2015 Time: 4098-1191 SLP Time Calculation (min) (ACUTE ONLY): 21 min  Problem List:  Patient Active Problem List   Diagnosis Date Noted  . TIA (transient ischemic attack) 09/03/2015  . Atrial fibrillation 09/03/2015  . Essential hypertension 09/03/2015  . Hypothyroidism 09/03/2015   Past Medical History:  Past Medical History  Diagnosis Date  . A-fib    Past Surgical History:  Past Surgical History  Procedure Laterality Date  . Abdominal hysterectomy    . Appendectomy     HPI:  Pt is an 81 y/o femal with PMH of A Fib, HTN, and hypothyroidism.  Pt presented to ED with blurry vision, difficulty finding words, and confusion.  No acute abnormalities found on CT and pt refusing MRI.   Assessment / Plan / Recommendation Clinical Impression  Pt's cognitive-linguistic function appears [facility] for tasks assessed during completion of Cognistat. Pt reports having difficulty remembering names at baseline, for which SLP provided compensatory strategies. Pt also reports a gradual onset of "jumbling" her words, although this was not appreciated throughout the duration of this evaluation. No acute SLP needs were identified at this time, however encouraged pt to discuss any ongoing diffiulties with her MD.    SLP Assessment  Patient does not need any further Speech Lanaguage Pathology Services    Follow Up Recommendations  None    Pertinent Vitals/Pain Pain Assessment: No/denies pain   SLP Goals  Patient/Family Stated Goal: none stated  SLP Evaluation Prior Functioning  Cognitive/Linguistic Baseline: Within functional limits Type of Home: Independent living facility  Lives With: Alone   Cognition  Overall Cognitive Status: Within Functional Limits for tasks assessed    Comprehension  Auditory Comprehension Overall Auditory Comprehension: Appears within  functional limits for tasks assessed Visual Recognition/Discrimination Discrimination: Within Function Limits    Expression Expression Primary Mode of Expression: Verbal Verbal Expression Overall Verbal Expression: Appears within functional limits for tasks assessed   Oral / Motor Oral Motor/Sensory Function Overall Oral Motor/Sensory Function: Appears within functional limits for tasks assessed Motor Speech Overall Motor Speech: Appears within functional limits for tasks assessed   GO Functional Assessment Tool Used: skilled clinical judgment Functional Limitations: Memory Memory Current Status (Y7829): At least 1 percent but less than 20 percent impaired, limited or restricted Memory Goal Status (F6213): At least 1 percent but less than 20 percent impaired, limited or restricted Memory Discharge Status [phone]): At least 1 percent but less than 20 percent impaired, limited or restricted    Gabriella Shaw, M.A. CCC-SLP [phone]  Gabriella Shaw 09/04/2015, 4:07 PM

## 2015-09-04 NOTE — Evaluation (Signed)
Physical Therapy Evaluation Patient Details Name: Gabriella Shaw MRN: [id number] DOB: [birth year] Today's Date: 09/04/2015   History of Present Illness  Pt is an 81 y/o femal with PMH of A Fib, HTN, and hypothyroidism.  Pt presented to ED with blurry vision, difficulty finding words, and confusion.  No acute abnormalities found on CT and pt refusing MRI.  Clinical Impression  Pt presented to ED with blurry vision, difficulty word finding, and confusion.  She was alert/oriented this morning with anxious behaviors that it was "time for medication" and she worried that her ALF wouldn't know how to reach her.  Pt able to perform all functional mobility with independence and appears to be back to baseline.  She was able to ambulate with independence and was able to perform higher level balance activities (gait with head turns) without LOB.  No further acute PT or PT follow-up needed at this time.    Follow Up Recommendations Supervision - Intermittent    Equipment Recommendations  None recommended by PT    Recommendations for Other Services       Precautions / Restrictions Precautions Precautions: Fall Restrictions Weight Bearing Restrictions: No      Mobility  Bed Mobility Overal bed mobility: Modified Independent (bed rails)                Transfers Overall transfer level: Independent Equipment used: None                Ambulation/Gait Ambulation/Gait assistance: Independent Ambulation Distance (Feet): 230 Feet Assistive device: None Gait Pattern/deviations: WFL(Within Functional Limits)   Gait velocity interpretation: at or above normal speed for age/gender    Stairs            Wheelchair Mobility    Modified Rankin (Stroke Patients Only)       Balance Overall balance assessment: Needs assistance Sitting-balance support: No upper extremity supported;Feet supported Sitting balance-Leahy Scale: Good     Standing balance support: No upper  extremity supported;During functional activity Standing balance-Leahy Scale: Good               High level balance activites: Turns;Head turns High Level Balance Comments: able to maintain balance with head turns and turns without assistance             Pertinent Vitals/Pain Pain Assessment: No/denies pain    Home Living Family/patient expects to be discharged to:: Assisted living [facility]; Independent living)               Home Equipment: None      Prior Function Level of Independence: Independent (independent living at ALF)               Hand Dominance        Extremity/Trunk Assessment   Upper Extremity Assessment: Overall WFL for tasks assessed           Lower Extremity Assessment: Overall WFL for tasks assessed (R LE slightly less weaker than L but overall WFL)         Communication   Communication: No difficulties  Cognition Arousal/Alertness: Awake/alert Behavior During Therapy: Anxious (says its time for her medication and ALF unable to reach her) Overall Cognitive Status: Within Functional Limits for tasks assessed                      General Comments      Exercises        Assessment/Plan    PT Assessment Patent does  not need any further PT services  PT Diagnosis Other (comment);Altered mental status (vision changes)   PT Problem List    PT Treatment Interventions     PT Goals (Current goals can be found in the Care Plan section)      Frequency     Barriers to discharge        Co-evaluation               End of Session Equipment Utilized During Treatment: Gait belt Activity Tolerance: Patient tolerated treatment well Patient left: in chair;with call bell/phone within reach Nurse Communication: Mobility status;Other (comment) (pt requests for medication)         Time: 0902-0912 PT Time Calculation (min) (ACUTE ONLY): 10 min   Charges:   PT Evaluation $Initial PT Evaluation Tier I: 1  Procedure     PT G Codes:   PT G-Codes **NOT FOR INPATIENT CLASS** Functional Assessment Tool Used: clinical jusgement Functional Limitation: Mobility: Walking and moving around Mobility: Walking and Moving Around Current Status (U9811): 0 percent impaired, limited or restricted Mobility: Walking and Moving Around Goal Status (B1478): 0 percent impaired, limited or restricted Mobility: Walking and Moving Around Discharge Status [phone]): 0 percent impaired, limited or restricted    Arnoldo Morale 09/04/2015, 10:10 AM  Arnoldo Morale, SPT

## 2015-09-04 NOTE — Progress Notes (Signed)
PT Cancellation Note  Patient Details Name: Gabriella Shaw MRN: [id number] DOB: November 09, 1927   Cancelled Treatment:    Reason Eval/Treat Not Completed: Medical issues which prohibited therapy (pt with active bed rest orders)   Arnoldo Morale 09/04/2015, 8:06 AM  Arnoldo Morale, SPT

## 2015-09-05 DIAGNOSIS — E039 Hypothyroidism, unspecified: Secondary | ICD-10-CM | POA: Diagnosis not present

## 2015-09-05 DIAGNOSIS — I4891 Unspecified atrial fibrillation: Secondary | ICD-10-CM | POA: Diagnosis not present

## 2015-09-05 DIAGNOSIS — G451 Carotid artery syndrome (hemispheric): Secondary | ICD-10-CM | POA: Diagnosis not present

## 2015-09-05 LAB — HEMOGLOBIN A1C
HEMOGLOBIN A1C: 5.5 % (ref 4.8–5.6)
Hgb A1c MFr Bld: 5.4 % (ref 4.8–5.6)
Mean Plasma Glucose: 111 mg/dL
Mean Plasma Glucose: 108 mg/dL

## 2015-09-05 MED ORDER — RIVAROXABAN 15 MG PO TABS: 15.0000 mg | ORAL_TABLET | Freq: Every day | ORAL | Status: AC

## 2015-09-05 MED ORDER — ATORVASTATIN CALCIUM 40 MG PO TABS: 40.0000 mg | ORAL_TABLET | Freq: Every day | ORAL | Status: AC

## 2015-09-05 NOTE — Discharge Summary (Signed)
Physician Discharge Summary  Rivkah Wolz ZOX:[id number] DOB: December 21, 1926 DOA: 09/03/2015  PCP: No primary care provider on file.  Admit date: 09/03/2015 Discharge date: 09/05/2015  Time spent: 35 minutes  Recommendations for Outpatient Follow-up:  To [facility]  Discharge Diagnoses:  Principal Problem:   TIA (transient ischemic attack) Active Problems:   Atrial fibrillation   Essential hypertension   Hypothyroidism   Discharge Condition: improved  Diet recommendation: cardiac  Filed Weights   09/03/15 1439 09/03/15 1440 09/03/15 2237  Weight: 58.968 kg (130 lb) 58.968 kg (130 lb) 55.702 kg (122 lb 12.8 oz)    History of present illness:  Gabriella Shaw is a 81 y.o. female with a past medical atrial fibrillation, hypertension, hypothyroidism who comes to the emergency department due to having blurred vision, followed by inability to read the book, difficulty finding words and confusion. She states that around 11:30 in the morning she is trying to read a book and noticed that she was unable to read the words, so she tried to read and other book and noticed the same thing again. She then went to see the health care personnel at her facility and there she was noticed to have confusion, so EMS was called and the patient was brought to the emergency department. However, since then the symptoms have completely resolved. She is currently in no acute distress.  Hospital Course:  Possible TIA: Dominant probably embolic secondary to atrial fibrillation without anticoagulation.  Resultant resolution of deficits  MRI not performed secondary to claustrophobia  MRA not performed secondary to claustrophobia  Carotid Doppler unremarkable  2D Echo EF 60-65%. No cardiac source of emboli identified.  LDL 176- statin added  HgbA1c ok   Xarelto added by neuro  No PT/OT follow up  Hypertension  Stable   Hyperlipidemia  Home meds: No lipid lowering medications prior to  admission  LDL 170, goal < 70   Lipitor   Procedures: None  Consultations:  neuro  Discharge Exam: Filed Vitals:   09/05/15 0539  BP: 128/57  Pulse: 57  Temp: 97.7 F (36.5 C)  Resp: 18    General: awake, NAD- all questions answered   Discharge Instructions   Discharge Instructions    Diet - low sodium heart healthy    Complete by:  As directed      Discharge instructions    Complete by:  As directed   Cbc 1 week re new xarelto addition     Increase activity slowly    Complete by:  As directed           Current Discharge Medication List    START taking these medications   Details  atorvastatin (LIPITOR) 40 MG tablet Take 1 tablet (40 mg total) by mouth daily at 6 PM. Qty: 30 tablet, Refills: 0    Rivaroxaban (XARELTO) 15 MG TABS tablet Take 1 tablet (15 mg total) by mouth daily with supper. Qty: 30 tablet, Refills: 0      CONTINUE these medications which have NOT CHANGED   Details  Ascorbic Acid (VITAMIN C PO) Take 1 tablet by mouth daily.    Cholecalciferol (VITAMIN D PO) Take by mouth.    Cyanocobalamin (VITAMIN B 12 PO) Take 1 tablet by mouth daily.    digoxin (LANOXIN) 0.125 MG tablet Take 0.125 mg by mouth daily.    glucosamine-chondroitin 500-400 MG tablet Take 1 tablet by mouth 3 (three) times daily.    ipratropium (ATROVENT) 0.03 % nasal spray Place 2  sprays into both nostrils every 12 (twelve) hours.    levothyroxine (SYNTHROID, LEVOTHROID) 75 MCG tablet Take 75 mcg by mouth daily before breakfast.    metoprolol tartrate (LOPRESSOR) 25 MG tablet Take 37.5 mg by mouth 2 (two) times daily.     omega-3 acid ethyl esters (LOVAZA) 1 G capsule Take by mouth 2 (two) times daily.    VITAMIN A PO Take 1 tablet by mouth daily.    VITAMIN E PO Take 1 tablet by mouth daily.    zolpidem (AMBIEN) 5 MG tablet Take 2.5 mg by mouth at bedtime.      STOP taking these medications     aspirin 81 MG tablet        No Known Allergies    The  results of significant diagnostics from this hospitalization (including imaging, microbiology, ancillary and laboratory) are listed below for reference.    Significant Diagnostic Studies: Dg Chest 2 View  09/04/2015   CLINICAL DATA:  Transient ischemic attack.  EXAM: CHEST  2 VIEW  COMPARISON:  None.  FINDINGS: The heart size and mediastinal contours are within normal limits. No pneumothorax or pleural effusion is noted. Mild biapical scarring is noted. No consolidation is noted. Linear density is noted in right upper lobe with nodular thickening seen laterally most consistent with scarring. Mild multilevel degenerative disc disease is noted in the mid thoracic spine.  IMPRESSION: Biapical scarring is noted. Probable scarring is noted in right upper lobe, with focal nodular thickening seen laterally in this area which also may represent scarring or granuloma. Follow-up radiographs in 3 months is recommended to ensure stability and rule out possible neoplasm or malignancy.   Electronically Signed   By: Lupita Raider, M.D.   On: 09/04/2015 09:14   Ct Head Wo Contrast  09/03/2015   CLINICAL DATA:  Acute dizziness and blurred vision today.  EXAM: CT HEAD WITHOUT CONTRAST  TECHNIQUE: Contiguous axial images were obtained from the base of the skull through the vertex without intravenous contrast.  COMPARISON:  None.  FINDINGS: Mild generalized cerebral volume loss is noted.  No acute intracranial abnormalities are identified, including mass lesion or mass effect, hydrocephalus, extra-axial fluid collection, midline shift, hemorrhage, or acute infarction.  The visualized bony calvarium is unremarkable.  IMPRESSION: No evidence of acute intracranial abnormality.  Mild atrophy.   Electronically Signed   By: Harmon Pier M.D.   On: 09/03/2015 18:31    Microbiology: No results found for this or any previous visit (from the past 240 hour(s)).   Labs: Basic Metabolic Panel:  Recent Labs Lab 09/03/15 1643  NA  138  K 3.7  CL 104  CO2 25  GLUCOSE 103*  BUN 10  CREATININE 0.70  CALCIUM 9.7   Liver Function Tests: No results for input(s): AST, ALT, ALKPHOS, BILITOT, PROT, ALBUMIN in the last 168 hours. No results for input(s): LIPASE, AMYLASE in the last 168 hours. No results for input(s): AMMONIA in the last 168 hours. CBC:  Recent Labs Lab 09/03/15 1643  WBC 8.5  NEUTROABS 5.3  HGB 13.9  HCT 41.2  MCV 96.0  PLT 289   Cardiac Enzymes: No results for input(s): CKTOTAL, CKMB, CKMBINDEX, TROPONINI in the last 168 hours. BNP: BNP (last 3 results) No results for input(s): BNP in the last 8760 hours.  ProBNP (last 3 results) No results for input(s): PROBNP in the last 8760 hours.  CBG: No results for input(s): GLUCAP in the last 168 hours.  SignedMarlin Canary  [facility] 09/05/2015, 8:05 AM

## 2015-09-05 NOTE — Progress Notes (Signed)
transported per wc from unit in no acute distress. No c/o new weakness, no slurring. Alert oriented. Dc scrips instructions given.

## 2015-09-05 NOTE — Progress Notes (Signed)
STROKE TEAM PROGRESS NOTE  HPI Gabriella Shaw is an 81 y.o. female, right handed, very functional individual who plays golf regularly, with a past medical history that is significant for HTN, chronic atrial fibrillation, brought in by EMS for evaluation of transient visual disturbances, confusion, and dysphasia. Patient lives at an assisted living facility and said that she never had similar symptoms before. Stated that before lunch time she was reading and suddenly her vision became so blurry that she couldn't read words, "it was like words on the pages weren't there or the pages were blank ".  Gabriella Shaw indicated that she then went to the clinic at [facility] where she reports she was confuse, unable to name the president or recall her son's name, and was unable to get her words out. The whole episode lasted for approximately one hour and resolved. No associated HA, vertigo, double vision, focal weakness or numbness. CT brain performed in the ED was personally reviewed and showed no acute abnormality. A fib documented in the ED. Stated that she has been taking aspirin 81 mg daily. Of note, she reports an episode of transient painless visual loss left eye approximately 5 or 6 years ago.   Date last known well: 09/03/15 Time last known well: noon time  tPA Given: no, symptoms resolved   SUBJECTIVE (INTERVAL HISTORY) The patient is ready for discharge. She has no questions. OBJECTIVE Temp:  [97.7 F (36.5 C)-97.9 F (36.6 C)] 97.9 F (36.6 C) (09/21 1017) Pulse Rate:  [54-57] 54 (09/21 1017) Cardiac Rhythm:  [-] Sinus bradycardia (09/21 0813) Resp:  [18-20] 20 (09/21 1017) BP: (122-128)/(45-57) 122/57 mmHg (09/21 1017) SpO2:  [97 %-98 %] 97 % (09/21 1017)  CBC:   Recent Labs Lab 09/03/15 1643  WBC 8.5  NEUTROABS 5.3  HGB 13.9  HCT 41.2  MCV 96.0  PLT 289    Basic Metabolic Panel:   Recent Labs Lab 09/03/15 1643  NA 138  K 3.7  CL 104  CO2 25  GLUCOSE 103*   BUN 10  CREATININE 0.70  CALCIUM 9.7    Lipid Panel:     Component Value Date/Time   CHOL 270* 09/04/2015 0633   TRIG 128 09/04/2015 0633   HDL 68 09/04/2015 0633   CHOLHDL 4.0 09/04/2015 0633   VLDL 26 09/04/2015 0633   LDLCALC 176* 09/04/2015 0633   HgbA1c:  Lab Results  Component Value Date   HGBA1C 5.4 09/04/2015   Urine Drug Screen:     Component Value Date/Time   LABOPIA NONE DETECTED 09/03/2015 1500   COCAINSCRNUR NONE DETECTED 09/03/2015 1500   LABBENZ NONE DETECTED 09/03/2015 1500   AMPHETMU NONE DETECTED 09/03/2015 1500   THCU NONE DETECTED 09/03/2015 1500   LABBARB NONE DETECTED 09/03/2015 1500      IMAGING  Ct Head Wo Contrast 09/03/2015    No evidence of acute intracranial abnormality.  Mild atrophy.      PHYSICAL EXAM Pleasant elderly lady currently not in distress. . Afebrile. Head is nontraumatic. Neck is supple without bruit.    Cardiac exam no murmur or gallop. Lungs are clear to auscultation. Distal pulses are well felt.  Neurological Exam :  Awake alert oriented 2. Speech and normal language appear normal. The major tension, registration and recall. Extraocular movements are full range without nystagmus. Face is symmetric without weakness. Tongue is midline. Motor system exam reveals symmetric upper and lower extremity strength without focal weakness. The patellar reflexes are symmetric. Sensation is intact. Plantars  are downgoing. Gait was not tested.   ASSESSMENT/PLAN Gabriella Shaw is a 81 y.o. female with history of atrial fibrillation, essential hypertension, and hypothyroidism presenting with transient visual disturbances, confusion, and dysphasia. She did not receive IV t-PA due to resolution of deficits.   Possible TIA: Dominant  probably embolic secondary to atrial fibrillation without anticoagulation.  Resultant  resolution of deficits  MRI  not performed secondary to claustrophobia  MRA  not performed secondary to  claustrophobia  Carotid Doppler  unremarkable  2D Echo  EF 60-65%. No cardiac source of emboli identified.  LDL 176  HgbA1c  5.5  VTE prophylaxis - Xarelto Diet - low sodium heart healthy  aspirin 81 mg orally every day prior to admission, now on xarelto ( rivaroxaban)  Patient counseled to be compliant with her antithrombotic medications  Ongoing aggressive stroke risk factor management  Therapy recommendations: No follow-up therapy recommended. Physical therapy evaluation pending.  Disposition: Pending  Hypertension  Stable   Hyperlipidemia  Home meds: No lipid lowering medications prior to admission  LDL 170, goal < 70  Will start Lipitor 40 mg daily  Continue statin at discharge    Other Stroke Risk Factors  Advanced age  ETOH use   Other Active Problems    Hospital day # 2    Discharge patient home on long-term anticoagulation. Stroke team will sign off. Follow-up as an outpatient in stroke clinic in 2 months. Delia Heady, MD Medical Director [facility] Pager: [phone] 09/05/2015 9:12 PM    To contact Stroke Continuity provider, please refer to [email or web address]. After hours, contact General Neurology

## 2015-09-05 NOTE — Care Management Note (Signed)
Case Management Note  Patient Details  Name: Symia Herdt MRN: [id number] Date of Birth: 1927-07-11  Subjective/Objective:                    Action/Plan: Patient admitted with TIA. Pt is from [place]. Patient being discharged on Xarelto 15 mg daily. Benefits check done and Xarelto will cost the patient $45 a month. CM met with patient and her son and given the 30 day free card for Xarelto. Also went over the co pay for Xarelto and patient stated she could afford this co pay. Bedside RN aware.   Expected Discharge Date:                  Expected Discharge Plan:  Assisted Living / Rest Home  In-House Referral:     Discharge planning Services  CM Consult  Post Acute Care Choice:    Choice offered to:     DME Arranged:    DME Agency:     HH Arranged:    [place] Agency:     Status of Service:  Completed, signed off  Medicare Important Message Given:    Date Medicare IM Given:    Medicare IM give by:    Date Additional Medicare IM Given:    Additional Medicare Important Message give by:     If discussed at [place] of Stay Meetings, dates discussed:    Additional Comments:  Pollie Friar, RN 09/05/2015, 12:07 PM

## 2018-10-08 ENCOUNTER — Encounter (HOSPITAL_BASED_OUTPATIENT_CLINIC_OR_DEPARTMENT_OTHER): Payer: Self-pay

## 2018-10-08 ENCOUNTER — Emergency Department (HOSPITAL_BASED_OUTPATIENT_CLINIC_OR_DEPARTMENT_OTHER)
Admission: EM | Admit: 2018-10-08 | Discharge: 2018-10-09 | Disposition: A | Discharge status: Home / Self Care | Payer: Medicare Other | Attending: Emergency Medicine | Admitting: Emergency Medicine

## 2018-10-08 DIAGNOSIS — Z79899 Other long term (current) drug therapy: Secondary | ICD-10-CM | POA: Diagnosis not present

## 2018-10-08 DIAGNOSIS — Y998 Other external cause status: Secondary | ICD-10-CM | POA: Insufficient documentation

## 2018-10-08 DIAGNOSIS — Z7901 Long term (current) use of anticoagulants: Secondary | ICD-10-CM | POA: Insufficient documentation

## 2018-10-08 DIAGNOSIS — I10 Essential (primary) hypertension: Secondary | ICD-10-CM | POA: Diagnosis not present

## 2018-10-08 DIAGNOSIS — W19XXXA Unspecified fall, initial encounter: Secondary | ICD-10-CM | POA: Insufficient documentation

## 2018-10-08 DIAGNOSIS — E039 Hypothyroidism, unspecified: Secondary | ICD-10-CM | POA: Diagnosis not present

## 2018-10-08 DIAGNOSIS — R262 Difficulty in walking, not elsewhere classified: Secondary | ICD-10-CM | POA: Diagnosis present

## 2018-10-08 DIAGNOSIS — Y92129 Unspecified place in nursing home as the place of occurrence of the external cause: Secondary | ICD-10-CM | POA: Insufficient documentation

## 2018-10-08 DIAGNOSIS — Z8673 Personal history of transient ischemic attack (TIA), and cerebral infarction without residual deficits: Secondary | ICD-10-CM | POA: Diagnosis not present

## 2018-10-08 DIAGNOSIS — Y939 Activity, unspecified: Secondary | ICD-10-CM | POA: Insufficient documentation

## 2018-10-08 DIAGNOSIS — R410 Disorientation, unspecified: Secondary | ICD-10-CM | POA: Diagnosis not present

## 2018-10-08 HISTORY — DX: Transient cerebral ischemic attack, unspecified: G45.9

## 2018-10-08 HISTORY — DX: Essential (primary) hypertension: I10

## 2018-10-08 NOTE — ED Triage Notes (Signed)
Pt from [employer]. Pt was found by security to be crawling around on floor in the hallway. Pt has had 4 scotch drinks tonight. Family wanted pt transported to ED d/t hx of TIA. Pt is alert and oriented.

## 2018-10-09 ENCOUNTER — Other Ambulatory Visit: Payer: Self-pay

## 2018-10-09 NOTE — ED Provider Notes (Signed)
[facility] EMERGENCY DEPARTMENT Provider Note   CSN: [id number] Arrival date & time: 10/08/18  2346     History   Chief Complaint Chief Complaint  Patient presents with  . Difficulty Walking    HPI Gabriella Shaw is a 84 y.o. female.  The history is provided by the patient and a relative.  Patient presents from nursing facility for possible fall.  She reports she feels well.  However per report, she was found crawling on the floor by security.  Nursing staff called family who advised evaluation in the ER.  Patient reports she feels at her baseline.  She reports she drank her usual scotch around 5 PM.  She also took her usual Ambien at bedtime and was feeling well.  However she did get out of bed to help her friend, she reports she was going to give her friend wine to help her sleep.  The next thing she knows she was found in the floor.  She denies any headache, chest pain, abdominal pain.  She has no extremity pain.  She had otherwise been at her baseline.  Past Medical History:  Diagnosis Date  . A-fib (HCC)   . Hypertension   . TIA (transient ischemic attack)     Patient Active Problem List   Diagnosis Date Noted  . TIA (transient ischemic attack) 09/03/2015  . Atrial fibrillation (HCC) 09/03/2015  . Essential hypertension 09/03/2015  . Hypothyroidism 09/03/2015    Past Surgical History:  Procedure Laterality Date  . ABDOMINAL HYSTERECTOMY    . APPENDECTOMY       OB History   None      Home Medications    Prior to Admission medications   Medication Sig Start Date End Date Taking? Authorizing Provider  Ascorbic Acid (VITAMIN C PO) Take 1 tablet by mouth daily.    [provider]  atorvastatin (LIPITOR) 40 MG tablet Take 1 tablet (40 mg total) by mouth daily at 6 PM. 09/05/15   Joseph Art, DO  Cholecalciferol (VITAMIN D PO) Take by mouth.    [provider]  Cyanocobalamin (VITAMIN B 12 PO) Take 1 tablet by mouth daily.     [provider]  digoxin (LANOXIN) 0.125 MG tablet Take 0.125 mg by mouth daily.    [provider]  glucosamine-chondroitin 500-400 MG tablet Take 1 tablet by mouth 3 (three) times daily.    [provider]  ipratropium (ATROVENT) 0.03 % nasal spray Place 2 sprays into both nostrils every 12 (twelve) hours.    [provider]  levothyroxine (SYNTHROID, LEVOTHROID) 75 MCG tablet Take 75 mcg by mouth daily before breakfast.    [provider]  metoprolol tartrate (LOPRESSOR) 25 MG tablet Take 37.5 mg by mouth 2 (two) times daily.     [provider]  omega-3 acid ethyl esters (LOVAZA) 1 G capsule Take by mouth 2 (two) times daily.    [provider]  Rivaroxaban (XARELTO) 15 MG TABS tablet Take 1 tablet (15 mg total) by mouth daily with supper. 09/05/15   Joseph Art, DO  VITAMIN A PO Take 1 tablet by mouth daily.    [provider]  VITAMIN E PO Take 1 tablet by mouth daily.    [provider]  zolpidem (AMBIEN) 5 MG tablet Take 2.5 mg by mouth at bedtime.    [provider]    Family History History reviewed. No pertinent family history.  Social History Social History  Tobacco Use  . Smoking status: Unknown If Ever Smoked  . Smokeless tobacco: Former Engineer, water Use Topics  . Alcohol use: Yes    Comment: drinks 3-4 oz of scotch daily  . Drug use: No     Allergies   Patient has no known allergies.   Review of Systems Review of Systems  Constitutional: Negative for fever.  Cardiovascular: Negative for chest pain.  Gastrointestinal: Negative for abdominal pain.  Musculoskeletal: Negative for back pain and neck pain.  Neurological: Negative for weakness and headaches.  All other systems reviewed and are negative.    Physical Exam Updated Vital Signs BP (!) 155/74 (BP Location: Right Arm)   Pulse 65   Temp 97.7 F (36.5 C) (Oral)   Resp 16   Ht 1.702 m (5\' 7" )   Wt 59 kg    SpO2 97%   BMI 20.36 kg/m   Physical Exam CONSTITUTIONAL: Well developed/well nourished, well-appearing, no acute distress, resting comfortably in her robe HEAD: Normocephalic/atraumatic EYES: EOMI/PERRL ENMT: Mucous membranes moist NECK: supple no meningeal signs SPINE/BACK:entire spine nontender CV: S1/S2 noted, irregular LUNGS: Lungs are clear to auscultation bilaterally, no apparent distress ABDOMEN: soft, nontender, no rebound or guarding, bowel sounds noted throughout abdomen GU:no cva tenderness NEURO: Pt is awake/alert/appropriate, moves all extremitiesx4.  No facial droop.  No arm or leg drift.  Patient ambulates without difficulty.  No ataxia EXTREMITIES: pulses normal/equal, full ROM, all other extremities/joints palpated/ranged and nontender SKIN: warm, color normal PSYCH: no abnormalities of mood noted, alert and oriented to situation   ED Treatments / Results  Labs (all labs ordered are listed, but only abnormal results are displayed) Labs Reviewed - No data to display  EKG None  Radiology No results found.  Procedures Procedures     Medications Ordered in ED Medications - No data to display   Initial Impression / Assessment and Plan / ED Course  I have reviewed the triage vital signs and the nursing notes.    Pt awake/alert, no distress She is well appearing No neuro deficits History does not suggest acute stroke.  She is afebrile, and is otherwise at her baseline She can walk unassisted Suspect this episode of crawling on floor/confusion due to [place] use I strongly encouraged her not to mix ambien/alcohol even if several hrs apart Pt/son comfortable with plan of going back to facility Nursing staff spoke to [employer] and confirmed story, no other details   Final Clinical Impressions(s) / ED Diagnoses   Final diagnoses:  Confusion    ED Discharge Orders    None       Zadie Rhine, MD 10/09/18 0144

## 2019-03-11 ENCOUNTER — Emergency Department (HOSPITAL_COMMUNITY): Payer: Medicare Other

## 2019-03-11 ENCOUNTER — Encounter (HOSPITAL_COMMUNITY): Payer: Self-pay | Admitting: Emergency Medicine

## 2019-03-11 ENCOUNTER — Other Ambulatory Visit: Payer: Self-pay

## 2019-03-11 ENCOUNTER — Emergency Department (HOSPITAL_COMMUNITY)
Admission: EM | Admit: 2019-03-11 | Discharge: 2019-03-11 | Disposition: A | Discharge status: Home / Self Care | Payer: Medicare Other | Attending: Emergency Medicine | Admitting: Emergency Medicine

## 2019-03-11 DIAGNOSIS — Z7901 Long term (current) use of anticoagulants: Secondary | ICD-10-CM | POA: Insufficient documentation

## 2019-03-11 DIAGNOSIS — R0602 Shortness of breath: Secondary | ICD-10-CM

## 2019-03-11 DIAGNOSIS — Z87891 Personal history of nicotine dependence: Secondary | ICD-10-CM | POA: Insufficient documentation

## 2019-03-11 DIAGNOSIS — I10 Essential (primary) hypertension: Secondary | ICD-10-CM | POA: Insufficient documentation

## 2019-03-11 DIAGNOSIS — Z79899 Other long term (current) drug therapy: Secondary | ICD-10-CM | POA: Insufficient documentation

## 2019-03-11 DIAGNOSIS — I4891 Unspecified atrial fibrillation: Secondary | ICD-10-CM

## 2019-03-11 LAB — CBC WITH DIFFERENTIAL/PLATELET
Abs Immature Granulocytes: 0.05 10*3/uL (ref 0.00–0.07)
BASOS ABS: 0 10*3/uL (ref 0.0–0.1)
Basophils Relative: 0 %
Eosinophils Absolute: 0.1 10*3/uL (ref 0.0–0.5)
Eosinophils Relative: 1 %
HEMATOCRIT: 40.6 % (ref 36.0–46.0)
Hemoglobin: 13.9 g/dL (ref 12.0–15.0)
IMMATURE GRANULOCYTES: 1 %
LYMPHS ABS: 1.8 10*3/uL (ref 0.7–4.0)
Lymphocytes Relative: 19 %
MCH: 33.2 pg (ref 26.0–34.0)
MCHC: 34.2 g/dL (ref 30.0–36.0)
MCV: 96.9 fL (ref 80.0–100.0)
Monocytes Absolute: 1.2 10*3/uL — ABNORMAL HIGH (ref 0.1–1.0)
Monocytes Relative: 13 %
Neutro Abs: 6.2 10*3/uL (ref 1.7–7.7)
Neutrophils Relative %: 66 %
Platelets: 391 10*3/uL (ref 150–400)
RBC: 4.19 MIL/uL (ref 3.87–5.11)
RDW: 13.4 % (ref 11.5–15.5)
WBC: 9.3 10*3/uL (ref 4.0–10.5)
nRBC: 0 % (ref 0.0–0.2)

## 2019-03-11 LAB — TROPONIN I: Troponin I: <0.03 ng/mL (ref <0.03)

## 2019-03-11 LAB — COMPREHENSIVE METABOLIC PANEL
ALT: 18 U/L (ref 0–44)
AST: 22 U/L (ref 15–41)
Albumin: 3.8 g/dL (ref 3.5–5.0)
Alkaline Phosphatase: 67 U/L (ref 38–126)
Anion gap: 12 (ref 5–15)
BUN: 17 mg/dL (ref 8–23)
CO2: 20 mmol/L — ABNORMAL LOW (ref 22–32)
Calcium: 9.7 mg/dL (ref 8.9–10.3)
Chloride: 103 mmol/L (ref 98–111)
Creatinine, Ser: 0.82 mg/dL (ref 0.44–1.00)
GFR calc Af Amer: >60 mL/min (ref >60)
GFR calc non Af Amer: >60 mL/min (ref >60)
GLUCOSE: 107 mg/dL — AB (ref 70–99)
Potassium: 3.6 mmol/L (ref 3.5–5.1)
Sodium: 135 mmol/L (ref 135–145)
Total Bilirubin: 0.8 mg/dL (ref 0.3–1.2)
Total Protein: 6.6 g/dL (ref 6.5–8.1)

## 2019-03-11 LAB — DIGOXIN LEVEL: Digoxin Level: <0.2 ng/mL — ABNORMAL LOW (ref 0.8–2.0)

## 2019-03-11 NOTE — ED Provider Notes (Signed)
[facility] EMERGENCY DEPARTMENT Provider Note   CSN: [id number] Arrival date & time: 03/11/19  1649    History   Chief Complaint No chief complaint on file.   HPI Gabriella Shaw is a 85 y.o. female presenting for evaluation of shortness of breath.  Patient states that she developed shortness of breath this afternoon.  It is worse with ambulation.  She denies associated fevers, chills, sore throat, cough, chest pain, nausea, vomiting, abdominal pain, urinary symptoms, normal bowel movements, leg pain or swelling.  Patient has a history of A. fib, she states she has been taking Xarelto as prescribed.  Additional history obtained from chart review.  Per chart review, patient was evaluated by her PCP earlier this week for postnasal drip, rhinorrhea, and a mild cough.  She was given a Solu-Medrol shot, which patient states improved her symptoms.  Patient states she also had shortness of breath which felt like this at that time.     HPI  Past Medical History:  Diagnosis Date  . A-fib (HCC)   . Hypertension   . TIA (transient ischemic attack)     Patient Active Problem List   Diagnosis Date Noted  . TIA (transient ischemic attack) 09/03/2015  . Atrial fibrillation (HCC) 09/03/2015  . Essential hypertension 09/03/2015  . Hypothyroidism 09/03/2015    Past Surgical History:  Procedure Laterality Date  . ABDOMINAL HYSTERECTOMY    . APPENDECTOMY       OB History   No obstetric history on file.      Home Medications    Prior to Admission medications   Medication Sig Start Date End Date Taking? Authorizing Provider  Ascorbic Acid (VITAMIN C PO) Take 1 tablet by mouth daily.    [provider]  atorvastatin (LIPITOR) 40 MG tablet Take 1 tablet (40 mg total) by mouth daily at 6 PM. 09/05/15   Joseph Art, DO  Cholecalciferol (VITAMIN D PO) Take by mouth.    [provider]  Cyanocobalamin (VITAMIN B 12 PO) Take 1 tablet by mouth  daily.    [provider]  digoxin (LANOXIN) 0.125 MG tablet Take 0.125 mg by mouth daily.    [provider]  glucosamine-chondroitin 500-400 MG tablet Take 1 tablet by mouth 3 (three) times daily.    [provider]  ipratropium (ATROVENT) 0.03 % nasal spray Place 2 sprays into both nostrils every 12 (twelve) hours.    [provider]  levothyroxine (SYNTHROID, LEVOTHROID) 75 MCG tablet Take 75 mcg by mouth daily before breakfast.    [provider]  metoprolol tartrate (LOPRESSOR) 25 MG tablet Take 37.5 mg by mouth 2 (two) times daily.     [provider]  omega-3 acid ethyl esters (LOVAZA) 1 G capsule Take by mouth 2 (two) times daily.    [provider]  Rivaroxaban (XARELTO) 15 MG TABS tablet Take 1 tablet (15 mg total) by mouth daily with supper. 09/05/15   Joseph Art, DO  VITAMIN A PO Take 1 tablet by mouth daily.    [provider]  VITAMIN E PO Take 1 tablet by mouth daily.    [provider]  zolpidem (AMBIEN) 5 MG tablet Take 2.5 mg by mouth at bedtime.    [provider]    Family History No family history on file.  Social History Social History   Tobacco Use  . Smoking status: Unknown If Ever Smoked  . Smokeless tobacco: Former Engineer, water  Use Topics  . Alcohol use: Yes    Comment: drinks 3-4 oz of scotch daily  . Drug use: No     Allergies   Patient has no known allergies.   Review of Systems Review of Systems  Respiratory: Positive for shortness of breath.   All other systems reviewed and are negative.    Physical Exam Updated Vital Signs BP (!) 150/67   Pulse 83   Temp 97.9 F (36.6 C) (Oral)   Resp (!) 22   Ht 5\' 6"  (1.676 m)   Wt 56.7 kg   SpO2 99%   BMI 20.18 kg/m   Physical Exam Vitals signs and nursing note reviewed.  Constitutional:      General: She is not in acute distress.    Appearance: She is well-developed.     Comments: Appears  nontoxic. Appears younger than stated ages  HENT:     Head: Normocephalic and atraumatic.     Comments: OP non erythematous without tonsillar swelling or exudate. Uvula midline with equal palate rise.  Eyes:     Extraocular Movements: Extraocular movements intact.     Conjunctiva/sclera: Conjunctivae normal.     Pupils: Pupils are equal, round, and reactive to light.  Neck:     Musculoskeletal: Normal range of motion and neck supple.  Cardiovascular:     Rate and Rhythm: Normal rate. Rhythm irregular.     Pulses: Normal pulses.  Pulmonary:     Effort: Pulmonary effort is normal. No respiratory distress.     Breath sounds: Normal breath sounds. No wheezing.     Comments: Speaking in full sentences. Clear lung sounds in all fields. No respiratory distress.  Abdominal:     General: There is no distension.     Palpations: Abdomen is soft. There is no mass.     Tenderness: There is no abdominal tenderness. There is no guarding or rebound.  Musculoskeletal: Normal range of motion.     Comments: No leg pain or swelling  Skin:    General: Skin is warm and dry.     Capillary Refill: Capillary refill takes less than 2 seconds.  Neurological:     Mental Status: She is alert and oriented to person, place, and time.      ED Treatments / Results  Labs (all labs ordered are listed, but only abnormal results are displayed) Labs Reviewed  CBC WITH DIFFERENTIAL/PLATELET - Abnormal; Notable for the following components:      Result Value   Monocytes Absolute 1.2 (*)    All other components within normal limits  COMPREHENSIVE METABOLIC PANEL - Abnormal; Notable for the following components:   CO2 20 (*)    Glucose, Bld 107 (*)    All other components within normal limits  DIGOXIN LEVEL - Abnormal; Notable for the following components:   Digoxin Level <0.2 (*)    All other components within normal limits  TROPONIN I    EKG EKG Interpretation  Date/Time:  Friday March 11 2019 17:11:58  EDT Ventricular Rate:  69 PR Interval:    QRS Duration: 85 QT Interval:  435 QTC Calculation: 424 R Axis:   62 Text Interpretation:  Sinus rhythm Multiple premature complexes, vent & supraven Minimal ST depression, inferior leads No old tracing to compare Confirmed by Jacalyn Lefevre [id number]) on 03/11/2019 5:24:28 PM   Radiology Dg Chest 2 View  Result Date: 03/11/2019 CLINICAL DATA:  Shortness of breath. EXAM: CHEST - 2 VIEW COMPARISON:  09/03/2015 FINDINGS: The cardiomediastinal  silhouette is unchanged with normal heart size. Aortic atherosclerosis is noted. The lungs remain hyperinflated with unchanged scarring in the apices. Right upper lobe volume loss is again seen. No acute airspace consolidation, edema, pleural effusion, pneumothorax is identified. There is mild thoracic levoscoliosis. IMPRESSION: Chronic changes without evidence of active cardiopulmonary disease. Electronically Signed   By: Sebastian Ache M.D.   On: 03/11/2019 17:59    Procedures Procedures (including critical care time)  Medications Ordered in ED Medications - No data to display   Initial Impression / Assessment and Plan / ED Course  I have reviewed the triage vital signs and the nursing notes.  Pertinent labs & imaging results that were available during my care of the patient were reviewed by me and considered in my medical decision making (see chart for details).        Pt presenting for evaluation of SOB. physcial exam reassuring, she appears nontoxic. Doubt COVID-19, no travel or contacts, and sxs nor typical. As she is on anticoagluation, lower suyspicion ofr PE. sxs improve with steroids, consider COPD (although pt denies history). Will order labs, cxr, and reassess.   Chest x-ray viewed interpreted by me, no pneumonia, pneumothorax, effusion, cardiomegaly.  Chronic lung changes consistent with COPD noted.  Reassuring, no leukocytosis.  Trope negative.  EKG reassuring.  Discussed with attending, Dr.  Particia Nearing evaluated the patient.  Patient states her shortness of breath is improved.  She ambulated to the bathroom without signs of respiratory distress, upon returning to the room patient's pulse ox was at 96%, and no shortness of breath noted.  Discussed with patient.  Discussed follow-up with PCP for further evaluation, and return if symptoms worsen.  At this time, patient appears safe for discharge.  Return precautions given.  Patient states she understands and agrees plan.  Cha2ds2vasc: 6. On anticoagluation   Final Clinical Impressions(s) / ED Diagnoses   Final diagnoses:  Shortness of breath  Atrial fibrillation, unspecified type [facility])    ED Discharge Orders    None       Alveria Apley, PA-C 03/11/19 2019    Jacalyn Lefevre, MD 03/11/19 2144

## 2019-03-11 NOTE — ED Triage Notes (Signed)
[facility]- pt from [facility]. Pt has been SOB since yesterday.Pt is A&O x4. EMS reports clear lungs.    HR 80-100 afib 98% RA 120/64

## 2019-03-11 NOTE — ED Notes (Signed)
Pt.. ambulated to bathroom without help; steady gait.

## 2019-03-11 NOTE — ED Notes (Signed)
Patient transported to X-ray 

## 2019-03-11 NOTE — Discharge Instructions (Signed)
Follow-up with your primary care doctor next week for reevaluation of your symptoms. Return to the emergency room if you develop shortness of breath. Return if you develop shortness of breath, difficulty breathing, fever, and cough. Turn with any new, worsening, or concerning symptoms.

## 2019-03-11 NOTE — ED Notes (Signed)
Patient verbalizes understanding of discharge instructions. Opportunity for questioning and answers were provided. Armband removed by staff, pt discharged from ED via stretcher with PTAR.  

## 2019-03-14 ENCOUNTER — Telehealth (HOSPITAL_COMMUNITY): Payer: Self-pay | Admitting: *Deleted

## 2019-03-14 NOTE — Telephone Encounter (Signed)
PT on afib ED follow up list;   Follow up not appropriate per visit info.  No action needed at this time

## 2019-04-30 ENCOUNTER — Encounter (HOSPITAL_COMMUNITY): Payer: Self-pay | Admitting: Emergency Medicine

## 2019-04-30 ENCOUNTER — Other Ambulatory Visit: Payer: Self-pay

## 2019-04-30 ENCOUNTER — Emergency Department (HOSPITAL_COMMUNITY)
Admission: EM | Admit: 2019-04-30 | Discharge: 2019-04-30 | Disposition: A | Discharge status: Home / Self Care | Payer: Medicare Other | Attending: Emergency Medicine | Admitting: Emergency Medicine

## 2019-04-30 ENCOUNTER — Emergency Department (HOSPITAL_COMMUNITY): Payer: Medicare Other

## 2019-04-30 DIAGNOSIS — Z7901 Long term (current) use of anticoagulants: Secondary | ICD-10-CM | POA: Diagnosis not present

## 2019-04-30 DIAGNOSIS — Z8673 Personal history of transient ischemic attack (TIA), and cerebral infarction without residual deficits: Secondary | ICD-10-CM | POA: Insufficient documentation

## 2019-04-30 DIAGNOSIS — Z79899 Other long term (current) drug therapy: Secondary | ICD-10-CM | POA: Insufficient documentation

## 2019-04-30 DIAGNOSIS — R0602 Shortness of breath: Secondary | ICD-10-CM | POA: Diagnosis not present

## 2019-04-30 DIAGNOSIS — Z87891 Personal history of nicotine dependence: Secondary | ICD-10-CM | POA: Insufficient documentation

## 2019-04-30 DIAGNOSIS — I4891 Unspecified atrial fibrillation: Secondary | ICD-10-CM | POA: Insufficient documentation

## 2019-04-30 DIAGNOSIS — E039 Hypothyroidism, unspecified: Secondary | ICD-10-CM | POA: Diagnosis not present

## 2019-04-30 DIAGNOSIS — I10 Essential (primary) hypertension: Secondary | ICD-10-CM | POA: Insufficient documentation

## 2019-04-30 LAB — BASIC METABOLIC PANEL
Anion gap: 14 (ref 5–15)
BUN: 10 mg/dL (ref 8–23)
CO2: 21 mmol/L — ABNORMAL LOW (ref 22–32)
Calcium: 9.5 mg/dL (ref 8.9–10.3)
Chloride: 101 mmol/L (ref 98–111)
Creatinine, Ser: 0.88 mg/dL (ref 0.44–1.00)
GFR calc Af Amer: >60 mL/min (ref >60)
GFR calc non Af Amer: 58 mL/min — ABNORMAL LOW (ref >60)
Glucose, Bld: 105 mg/dL — ABNORMAL HIGH (ref 70–99)
Potassium: 3.5 mmol/L (ref 3.5–5.1)
Sodium: 136 mmol/L (ref 135–145)

## 2019-04-30 LAB — CBC WITH DIFFERENTIAL/PLATELET
Abs Immature Granulocytes: 0.03 10*3/uL (ref 0.00–0.07)
Basophils Absolute: 0 10*3/uL (ref 0.0–0.1)
Basophils Relative: 0 %
Eosinophils Absolute: 0.1 10*3/uL (ref 0.0–0.5)
Eosinophils Relative: 1 %
HCT: 38.4 % (ref 36.0–46.0)
Hemoglobin: 13 g/dL (ref 12.0–15.0)
Immature Granulocytes: 0 %
Lymphocytes Relative: 18 %
Lymphs Abs: 1.4 10*3/uL (ref 0.7–4.0)
MCH: 33.2 pg (ref 26.0–34.0)
MCHC: 33.9 g/dL (ref 30.0–36.0)
MCV: 98.2 fL (ref 80.0–100.0)
Monocytes Absolute: 1.1 10*3/uL — ABNORMAL HIGH (ref 0.1–1.0)
Monocytes Relative: 14 %
Neutro Abs: 5.4 10*3/uL (ref 1.7–7.7)
Neutrophils Relative %: 67 %
Platelets: 341 10*3/uL (ref 150–400)
RBC: 3.91 MIL/uL (ref 3.87–5.11)
RDW: 13 % (ref 11.5–15.5)
WBC: 8.1 10*3/uL (ref 4.0–10.5)
nRBC: 0 % (ref 0.0–0.2)

## 2019-04-30 LAB — TROPONIN I: Troponin I: <0.03 ng/mL (ref <0.03)

## 2019-04-30 LAB — D-DIMER, QUANTITATIVE: D-Dimer, Quant: <0.27 ug/mL-FEU (ref 0.00–0.50)

## 2019-04-30 MED ORDER — RISPERIDONE 0.5 MG PO TABS: 0.5000 mg | ORAL_TABLET | Freq: Once | ORAL | Status: DC

## 2019-04-30 MED ORDER — HYDROXYZINE HCL 25 MG PO TABS
25.0000 mg | ORAL_TABLET | Freq: Once | ORAL | Status: AC
  Administered 2019-04-30: 25 mg via ORAL
  Filled 2019-04-30: qty 1

## 2019-04-30 NOTE — ED Notes (Signed)
Patient transported to X-ray 

## 2019-04-30 NOTE — ED Notes (Signed)
RN called facility regarding any transportation for pt to get home. Stated they would contact their supervisor and call back.

## 2019-04-30 NOTE — ED Triage Notes (Signed)
Pt reports began feeling SOB. She walked to get her lunch and could not catch her breath. Pt denies chest pain.   Pt shows no signs of distress at this time. SpO2 100%RA

## 2019-04-30 NOTE — Discharge Instructions (Addendum)
You presented to the ED with shortness of breath. Your chest xray and labs were all normal. Please follow up with your PCP in the next week for re-evaluation of your symptoms.

## 2019-04-30 NOTE — ED Notes (Signed)
Pt ambulated well, pulse ox remained at or above 98% during this activity, still reports shob

## 2019-04-30 NOTE — ED Notes (Signed)
Called pt Son for update and possible ride home from hospital. Micah Flesher to VM. Left message.

## 2019-04-30 NOTE — ED Notes (Signed)
Brittney NT< walked patient. Pt maintained SpO2 of 98% RA and showed no signs of distress

## 2019-04-30 NOTE — ED Notes (Signed)
Pt is feeling very anxious about being short of breath and states she feels like there is a pressure on her chest.  Lung sounds are clear bi-lateral

## 2019-04-30 NOTE — ED Provider Notes (Signed)
[facility] EMERGENCY DEPARTMENT Provider Note   CSN: [id number] Arrival date & time: 04/30/19  1552    History   Chief Complaint Chief Complaint  Patient presents with  . Shortness of Breath    HPI Breiana Eckhardt is a 85 y.o. female with atrial fibrillation on xarelto, HTN and hypothyroidism presenting to the ED with SOB. She states she was in her usual state of health until around 1230 pm this afternoon when she suddenly felt short of breath. She states she was not doing anything in particular when she started feeling short of breath.  She states [place] and does not have any trouble however today she cannot catch her breath.  She denies any fevers, chills, chest pain, chest palpitations, nausea, vomiting, abdominal pain, trouble urinating or with bowel movements, lower extremity swelling or trouble laying flat.  Denies any known recent sick contacts or recent illnesses.  She states she had a similar episode in March and was sent home with inhalers.  She used an albuterol inhaler today with no improvement.  She states she is taking all her medications and has not missed any doses.     HPI  Past Medical History:  Diagnosis Date  . A-fib (HCC)   . Hypertension   . TIA (transient ischemic attack)     Patient Active Problem List   Diagnosis Date Noted  . TIA (transient ischemic attack) 09/03/2015  . Atrial fibrillation (HCC) 09/03/2015  . Essential hypertension 09/03/2015  . Hypothyroidism 09/03/2015    Past Surgical History:  Procedure Laterality Date  . ABDOMINAL HYSTERECTOMY    . APPENDECTOMY       OB History   No obstetric history on file.      Home Medications    Prior to Admission medications   Medication Sig Start Date End Date Taking? Authorizing Provider  Ascorbic Acid (VITAMIN C PO) Take 1 tablet by mouth daily.    [provider]  atorvastatin (LIPITOR) 40 MG tablet Take 1  tablet (40 mg total) by mouth daily at 6 PM. 09/05/15   Joseph Art, DO  Cholecalciferol (VITAMIN D PO) Take by mouth.    [provider]  Cyanocobalamin (VITAMIN B 12 PO) Take 1 tablet by mouth daily.    [provider]  digoxin (LANOXIN) 0.125 MG tablet Take 0.125 mg by mouth daily.    [provider]  glucosamine-chondroitin 500-400 MG tablet Take 1 tablet by mouth 3 (three) times daily.    [provider]  ipratropium (ATROVENT) 0.03 % nasal spray Place 2 sprays into both nostrils every 12 (twelve) hours.    [provider]  levothyroxine (SYNTHROID, LEVOTHROID) 75 MCG tablet Take 75 mcg by mouth daily before breakfast.    [provider]  metoprolol tartrate (LOPRESSOR) 25 MG tablet Take 37.5 mg by mouth 2 (two) times daily.     [provider]  omega-3 acid ethyl esters (LOVAZA) 1 G capsule Take by mouth 2 (two) times daily.    [provider]  Rivaroxaban (XARELTO) 15 MG TABS tablet Take 1 tablet (15 mg total) by mouth daily with supper. 09/05/15   Joseph Art, DO  VITAMIN A PO Take 1 tablet by mouth daily.    [provider]  VITAMIN E PO Take 1 tablet by mouth daily.    [provider]  zolpidem (AMBIEN) 5 MG tablet Take 2.5 mg by  mouth at bedtime.    [provider]    Family History No family history on file.  Social History Social History   Tobacco Use  . Smoking status: Unknown If Ever Smoked  . Smokeless tobacco: Former Engineer, waterUser  Substance Use Topics  . Alcohol use: Yes    Comment: drinks 3-4 oz of scotch daily  . Drug use: No     Allergies   Patient has no known allergies.   Review of Systems Review of Systems  Constitutional: Negative for chills, diaphoresis, fatigue and fever.  HENT: Negative for congestion, rhinorrhea, sinus pressure, sinus pain and sore throat.   Respiratory: Positive for shortness of breath. Negative for cough, chest tightness and wheezing.    Cardiovascular: Negative for chest pain, palpitations and leg swelling.  Gastrointestinal: Negative for abdominal pain, constipation, diarrhea, nausea and vomiting.  Genitourinary: Negative for difficulty urinating, dysuria, hematuria and urgency.  Musculoskeletal: Negative for gait problem and myalgias.  Neurological: Negative for dizziness, weakness, light-headedness and headaches.     Physical Exam Updated Vital Signs BP (!) 146/60 (BP Location: Right Arm)   Pulse 75   Temp 97.9 F (36.6 C)   Resp (!) 25   Ht 5\' 8"  (1.727 m)   Wt 56.7 kg   SpO2 100%   BMI 19.01 kg/m   Physical Exam Constitutional:      General: She is not in acute distress.    Appearance: She is well-developed. She is not ill-appearing or diaphoretic.  HENT:     Head: Normocephalic and atraumatic.  Cardiovascular:     Rate and Rhythm: Normal rate and regular rhythm.     Heart sounds: No murmur. No gallop.   Pulmonary:     Effort: Pulmonary effort is normal. No accessory muscle usage or respiratory distress.     Breath sounds: Normal breath sounds. No decreased breath sounds, wheezing, rhonchi or rales.  Chest:     Chest wall: No tenderness.  Abdominal:     General: Bowel sounds are normal.     Palpations: Abdomen is soft.     Tenderness: There is no abdominal tenderness.  Musculoskeletal:     Right lower leg: She exhibits no tenderness. No edema.     Left lower leg: She exhibits no tenderness. No edema.  Skin:    General: Skin is warm and dry.  Neurological:     General: No focal deficit present.     Mental Status: She is alert and oriented to person, place, and time.  Psychiatric:        Behavior: Behavior normal.      ED Treatments / Results  Labs (all labs ordered are listed, but only abnormal results are displayed) Labs Reviewed  BASIC METABOLIC PANEL - Abnormal; Notable for the following components:      Result Value   CO2 21 (*)    Glucose, Bld 105 (*)    GFR calc non Af Amer 58  (*)    All other components within normal limits  CBC WITH DIFFERENTIAL/PLATELET - Abnormal; Notable for the following components:   Monocytes Absolute 1.1 (*)    All other components within normal limits  DIGOXIN LEVEL - Abnormal; Notable for the following components:   Digoxin Level <0.2 (*)    All other components within normal limits  TROPONIN I  D-DIMER, QUANTITATIVE (NOT [facility])    EKG EKG Interpretation  Date/Time:  Saturday Apr 30 2019 16:01:03 EDT Ventricular Rate:  64 PR Interval:  QRS Duration: 92 QT Interval:  458 QTC Calculation: 473 R Axis:   70 Text Interpretation:  Normal sinus rhythm Atrial premature complexes No significant change since last tracing , similar nonspecific ST changes Confirmed by Alvira Monday ([id number]) on 04/30/2019 4:34:15 PM   Radiology Dg Chest 2 View  Result Date: 04/30/2019 CLINICAL DATA:  Acute onset of chest pressure and shortness of breath with associated anxiety. EXAM: CHEST - 2 VIEW COMPARISON:  03/11/2019, 09/03/2015. FINDINGS: Cardiac silhouette normal in size, unchanged. Thoracic aorta atherosclerotic and mildly tortuous, unchanged. Hilar and mediastinal contours otherwise unremarkable. Linear and nodular scarring involving the RIGHT UPPER LOBE, unchanged. Biapical pleuroparenchymal scarring, unchanged. Mildly prominent bronchovascular markings diffusely, unchanged. Lungs otherwise clear. No localized airspace consolidation. No pleural effusions. No pneumothorax. Normal pulmonary vascularity. Mild degenerative changes involving the thoracic spine. IMPRESSION: 1. No acute cardiopulmonary disease. 2. Stable mild changes of chronic bronchitis and/or asthma. Electronically Signed   By: Hulan Saas M.D.   On: 04/30/2019 17:53    Procedures Procedures (including critical care time)  Medications Ordered in ED Medications - No data to display   Initial Impression / Assessment and Plan / ED Course  I have reviewed the triage vital  signs and the nursing notes.  Pertinent labs & imaging results that were available during my care of the patient were reviewed by me and considered in my medical decision making (see chart for details).  Pt is a 85 yo female presenting to the ED with SOB since 1230 this afternoon. Physical exam is unremarkable, she is non-toxic appearing and saturating well on room air, 95%. No risk factors for COVID-19. She is on xarelto for atrial fibrillation so low suspicion for PE. Will order cxr, labs and reassess.   CXR shows no acute cardiopulmonary disease. Stable mild changes of chronic bronchitis and/or asthma. D-dimer negative. CBC and BMP wnl. Troponin negative. She is continuing to saturate well on room air. She ambulated without signs of respiratory distress. May have some underlying anxiety over health- per chart review she was recently given a short term course of clonopin for similar symptoms by her PCP. She is stable for discharge home. Discussed follow up with PCP and to return if symptoms worsen.    Final Clinical Impressions(s) / ED Diagnoses   Final diagnoses:  SOB (shortness of breath)    ED Discharge Orders    None       Rehman, Areeg N, DO 04/30/19 1858    Alvira Monday, MD 05/03/19 0004

## 2019-04-30 NOTE — ED Notes (Signed)
[facility] stated they would pick pt up.

## 2019-04-30 NOTE — ED Notes (Signed)
MD at bedside. 

## 2019-05-01 LAB — DIGOXIN LEVEL: Digoxin Level: <0.2 ng/mL — ABNORMAL LOW (ref 0.8–2.0)

## 2019-05-04 ENCOUNTER — Encounter (HOSPITAL_BASED_OUTPATIENT_CLINIC_OR_DEPARTMENT_OTHER): Payer: Self-pay | Admitting: Emergency Medicine

## 2019-05-04 ENCOUNTER — Other Ambulatory Visit: Payer: Self-pay

## 2019-05-04 ENCOUNTER — Emergency Department (HOSPITAL_BASED_OUTPATIENT_CLINIC_OR_DEPARTMENT_OTHER)
Admission: EM | Admit: 2019-05-04 | Discharge: 2019-05-04 | Disposition: A | Discharge status: Home / Self Care | Payer: Medicare Other | Attending: Emergency Medicine | Admitting: Emergency Medicine

## 2019-05-04 ENCOUNTER — Emergency Department (HOSPITAL_BASED_OUTPATIENT_CLINIC_OR_DEPARTMENT_OTHER): Payer: Medicare Other

## 2019-05-04 DIAGNOSIS — Z7901 Long term (current) use of anticoagulants: Secondary | ICD-10-CM | POA: Diagnosis not present

## 2019-05-04 DIAGNOSIS — R0602 Shortness of breath: Secondary | ICD-10-CM | POA: Insufficient documentation

## 2019-05-04 DIAGNOSIS — Z8673 Personal history of transient ischemic attack (TIA), and cerebral infarction without residual deficits: Secondary | ICD-10-CM | POA: Diagnosis not present

## 2019-05-04 DIAGNOSIS — E039 Hypothyroidism, unspecified: Secondary | ICD-10-CM | POA: Diagnosis not present

## 2019-05-04 DIAGNOSIS — I10 Essential (primary) hypertension: Secondary | ICD-10-CM | POA: Insufficient documentation

## 2019-05-04 DIAGNOSIS — Z79899 Other long term (current) drug therapy: Secondary | ICD-10-CM | POA: Diagnosis not present

## 2019-05-04 DIAGNOSIS — F419 Anxiety disorder, unspecified: Secondary | ICD-10-CM

## 2019-05-04 LAB — COMPREHENSIVE METABOLIC PANEL
ALT: 22 U/L (ref 0–44)
AST: 22 U/L (ref 15–41)
Albumin: 3.9 g/dL (ref 3.5–5.0)
Alkaline Phosphatase: 48 U/L (ref 38–126)
Anion gap: 10 (ref 5–15)
BUN: 10 mg/dL (ref 8–23)
CO2: 21 mmol/L — ABNORMAL LOW (ref 22–32)
Calcium: 9.1 mg/dL (ref 8.9–10.3)
Chloride: 102 mmol/L (ref 98–111)
Creatinine, Ser: 0.69 mg/dL (ref 0.44–1.00)
GFR calc Af Amer: >60 mL/min (ref >60)
GFR calc non Af Amer: >60 mL/min (ref >60)
Glucose, Bld: 100 mg/dL — ABNORMAL HIGH (ref 70–99)
Potassium: 3.1 mmol/L — ABNORMAL LOW (ref 3.5–5.1)
Sodium: 133 mmol/L — ABNORMAL LOW (ref 135–145)
Total Bilirubin: 1.1 mg/dL (ref 0.3–1.2)
Total Protein: 6.5 g/dL (ref 6.5–8.1)

## 2019-05-04 LAB — CBC WITH DIFFERENTIAL/PLATELET
Abs Immature Granulocytes: 0.02 10*3/uL (ref 0.00–0.07)
Basophils Absolute: 0 10*3/uL (ref 0.0–0.1)
Basophils Relative: 0 %
Eosinophils Absolute: 0.1 10*3/uL (ref 0.0–0.5)
Eosinophils Relative: 2 %
HCT: 39 % (ref 36.0–46.0)
Hemoglobin: 13.2 g/dL (ref 12.0–15.0)
Immature Granulocytes: 0 %
Lymphocytes Relative: 24 %
Lymphs Abs: 1.7 10*3/uL (ref 0.7–4.0)
MCH: 33.7 pg (ref 26.0–34.0)
MCHC: 33.8 g/dL (ref 30.0–36.0)
MCV: 99.5 fL (ref 80.0–100.0)
Monocytes Absolute: 0.8 10*3/uL (ref 0.1–1.0)
Monocytes Relative: 12 %
Neutro Abs: 4.3 10*3/uL (ref 1.7–7.7)
Neutrophils Relative %: 62 %
Platelets: 350 10*3/uL (ref 150–400)
RBC: 3.92 MIL/uL (ref 3.87–5.11)
RDW: 12.9 % (ref 11.5–15.5)
WBC: 7.1 10*3/uL (ref 4.0–10.5)
nRBC: 0 % (ref 0.0–0.2)

## 2019-05-04 LAB — URINALYSIS, ROUTINE W REFLEX MICROSCOPIC
Bilirubin Urine: NEGATIVE
Glucose, UA: NEGATIVE mg/dL
Hgb urine dipstick: NEGATIVE
Ketones, ur: 15 mg/dL — AB
Leukocytes,Ua: NEGATIVE
Nitrite: NEGATIVE
Protein, ur: NEGATIVE mg/dL
Specific Gravity, Urine: 1.01 (ref 1.005–1.030)
pH: 7.5 (ref 5.0–8.0)

## 2019-05-04 LAB — TROPONIN I: Troponin I: <0.03 ng/mL (ref <0.03)

## 2019-05-04 MED ORDER — LORAZEPAM 1 MG PO TABS
0.5000 mg | ORAL_TABLET | Freq: Once | ORAL | Status: AC
  Administered 2019-05-04: 17:00:00 0.5 mg via ORAL

## 2019-05-04 MED ORDER — POTASSIUM CHLORIDE CRYS ER 20 MEQ PO TBCR
40.0000 meq | EXTENDED_RELEASE_TABLET | Freq: Once | ORAL | Status: AC
  Administered 2019-05-04: 40 meq via ORAL
  Filled 2019-05-04: qty 2

## 2019-05-04 MED ORDER — CLONAZEPAM 0.5 MG PO TABS
0.5000 mg | ORAL_TABLET | Freq: Every day | ORAL | Status: DC
  Filled 2019-05-04: qty 1

## 2019-05-04 MED ORDER — LORAZEPAM 1 MG PO TABS
ORAL_TABLET | ORAL | Status: AC
  Filled 2019-05-04: qty 1

## 2019-05-04 NOTE — ED Notes (Signed)
Notified [facility]  for transport

## 2019-05-04 NOTE — ED Provider Notes (Signed)
[facility] EMERGENCY DEPARTMENT Provider Note   CSN: [id number] Arrival date & time: 05/04/19  1408    History   Chief Complaint Chief Complaint  Patient presents with  . Panic Attack    shortness of breath    HPI Gabriella Shaw is a 85 y.o. female.     85 y.o female with a PMH of Anxiety, HTN, TIA, Afib on Xarelto presents to the ED with a chief complaint of shortness of breath x today. According to EMS patient checked the mail and saw her bill then experience of shortness of breath.  However, patient reports she was standing outside when she began to feel shortness of breath, felt like she was not getting a deep breath in.  She reports she has been taking her medications as prescribed, was recently seen here for shortness of breath and had an unremarkable work-up.  Patient also reports she was recently prescribed Klonopin by her primary care physician due to anxiety.  She is unsure whether she is taking this medication at this time.  She denies any chest pain, dizziness, cough, fevers or presyncopal episodes.     Past Medical History:  Diagnosis Date  . A-fib (HCC)   . Hypertension   . TIA (transient ischemic attack)     Patient Active Problem List   Diagnosis Date Noted  . TIA (transient ischemic attack) 09/03/2015  . Atrial fibrillation (HCC) 09/03/2015  . Essential hypertension 09/03/2015  . Hypothyroidism 09/03/2015    Past Surgical History:  Procedure Laterality Date  . ABDOMINAL HYSTERECTOMY    . APPENDECTOMY       OB History   No obstetric history on file.      Home Medications    Prior to Admission medications   Medication Sig Start Date End Date Taking? Authorizing Provider  Ascorbic Acid (VITAMIN C PO) Take 1 tablet by mouth daily.    [provider]  atorvastatin (LIPITOR) 40 MG tablet Take 1 tablet (40 mg total) by mouth daily at 6 PM. 09/05/15   Joseph Art, DO  Cholecalciferol (VITAMIN D PO) Take by mouth.     [provider]  Cyanocobalamin (VITAMIN B 12 PO) Take 1 tablet by mouth daily.    [provider]  digoxin (LANOXIN) 0.125 MG tablet Take 0.125 mg by mouth daily.    [provider]  glucosamine-chondroitin 500-400 MG tablet Take 1 tablet by mouth 3 (three) times daily.    [provider]  ipratropium (ATROVENT) 0.03 % nasal spray Place 2 sprays into both nostrils every 12 (twelve) hours.    [provider]  levothyroxine (SYNTHROID, LEVOTHROID) 75 MCG tablet Take 75 mcg by mouth daily before breakfast.    [provider]  metoprolol tartrate (LOPRESSOR) 25 MG tablet Take 37.5 mg by mouth 2 (two) times daily.     [provider]  omega-3 acid ethyl esters (LOVAZA) 1 G capsule Take by mouth 2 (two) times daily.    [provider]  Rivaroxaban (XARELTO) 15 MG TABS tablet Take 1 tablet (15 mg total) by mouth daily with supper. 09/05/15   Joseph Art, DO  VITAMIN A PO Take 1 tablet by mouth daily.    [provider]  VITAMIN E PO Take 1 tablet by mouth daily.    [provider]  zolpidem (AMBIEN) 5 MG tablet Take 2.5 mg by mouth at bedtime.    [provider]    Family History No family history  on file.  Social History Social History   Tobacco Use  . Smoking status: Unknown If Ever Smoked  . Smokeless tobacco: Former Engineer, water Use Topics  . Alcohol use: Yes    Comment: drinks 3-4 oz of scotch daily  . Drug use: No     Allergies   Patient has no known allergies.   Review of Systems Review of Systems  Constitutional: Negative for chills and fever.  HENT: Positive for rhinorrhea. Negative for ear pain and sore throat.   Eyes: Negative for pain and visual disturbance.  Respiratory: Positive for shortness of breath. Negative for cough.   Cardiovascular: Negative for chest pain and palpitations.  Gastrointestinal: Negative for abdominal pain and vomiting.  Genitourinary:  Negative for dysuria and hematuria.  Musculoskeletal: Negative for arthralgias and back pain.  Skin: Negative for color change and rash.  Neurological: Negative for seizures and syncope.  All other systems reviewed and are negative.    Physical Exam Updated Vital Signs BP (!) 151/84   Pulse 63   Temp (!) 97.5 F (36.4 C) (Oral)   Resp 16   Ht 5' 7.5" (1.715 m)   Wt 56.7 kg   SpO2 100%   BMI 19.29 kg/m   Physical Exam Vitals signs and nursing note reviewed.  Constitutional:      General: She is not in acute distress.    Appearance: She is well-developed. She is not ill-appearing.     Comments: Non-ill-appearing.  HENT:     Head: Normocephalic and atraumatic.     Mouth/Throat:     Pharynx: No oropharyngeal exudate.  Eyes:     Pupils: Pupils are equal, round, and reactive to light.  Neck:     Musculoskeletal: Normal range of motion.  Cardiovascular:     Rate and Rhythm: Regular rhythm.     Heart sounds: Normal heart sounds.     Comments: No bilateral extremity edema. Pulmonary:     Effort: Pulmonary effort is normal. No respiratory distress.     Breath sounds: Normal breath sounds.     Comments: Lungs are clear to auscultation, no wheezing, rhonchi, rales. Chest:     Chest wall: No tenderness.  Abdominal:     General: Bowel sounds are normal. There is no distension.     Palpations: Abdomen is soft.     Tenderness: There is no abdominal tenderness. There is no right CVA tenderness or left CVA tenderness. Negative signs include Murphy's sign and McBurney's sign.     Hernia: No hernia is present.     Comments: Bowel sounds are present, no tenderness to palpation.  No CVA  BL tenderness.  Musculoskeletal:        General: No tenderness or deformity.     Right lower leg: No edema.     Left lower leg: No edema.  Skin:    General: Skin is warm and dry.  Neurological:     Mental Status: She is alert and oriented to person, place, and time.      ED Treatments /  Results  Labs (all labs ordered are listed, but only abnormal results are displayed) Labs Reviewed  COMPREHENSIVE METABOLIC PANEL - Abnormal; Notable for the following components:      Result Value   Sodium 133 (*)    Potassium 3.1 (*)    CO2 21 (*)    Glucose, Bld 100 (*)    All other components within normal limits  URINALYSIS, ROUTINE W REFLEX MICROSCOPIC - Abnormal;  Notable for the following components:   Ketones, ur 15 (*)    All other components within normal limits  CBC WITH DIFFERENTIAL/PLATELET  TROPONIN I    EKG None  Radiology Dg Chest 2 View  Result Date: 05/04/2019 CLINICAL DATA:  Shortness of breath this morning EXAM: CHEST - 2 VIEW COMPARISON:  Apr 30, 2019 FINDINGS: The heart size and mediastinal contours are within normal limits. Biapical scar are identified unchanged. Nodular opacity of the right upper lobe is unchanged. There is a questioned new 5.6 mm nodule in the left mid lung. There is no focal infiltrate, pulmonary edema, or pleural effusion. The visualized skeletal structures are stable. IMPRESSION: 1. No active cardiopulmonary disease. 2. Question new 5.6 mm nodule in the left mid lung. Further evaluation with a chest CT on outpatient basis is recommended. Electronically Signed   By: Sherian Rein M.D.   On: 05/04/2019 15:04    Procedures Procedures (including critical care time)  Medications Ordered in ED Medications  potassium chloride SA (K-DUR) CR tablet 40 mEq (40 mEq Oral Given 05/04/19 1642)  LORazepam (ATIVAN) tablet 0.5 mg (0.5 mg Oral Given 05/04/19 1707)     Initial Impression / Assessment and Plan / ED Course  I have reviewed the triage vital signs and the nursing notes.  Pertinent labs & imaging results that were available during my care of the patient were reviewed by me and considered in my medical decision making (see chart for details).     Patient with depressed medical history of A. fib, COPD presents to the ED with sudden onset  of shortness of breath, reports this has been constant ever since she was seen in the ED 4 days ago.  Does report this happened unexpectedly, was isolated without any dizziness, chest pain, diaphoresis.  Presenting at her last visit she was 95% on room air, today during arrival patient vital signs are stable, she is satting at 99% on room air, does report that is harder for her to take a deep breath in.  She was also requesting staff to leave the door open for the room as she cannot take a deep breath the door is closed.  D-dimer was obtained during the last visit which was negative. According to previous records she was given Klonopin to help with the symptoms by her PCP, patient is unsure whether she is taking this or not at this time.  CMP showed hypokalemia, will replace this orally at this time.  Creatinine level is within normal limits, LFTs are unremarkable.  CBC showed no leukocytosis, hemoglobin is within normal limits.  UA was negative for any nitrites, leukocytes, she denies any urinary symptoms at this time. First troponin was negative. Chest x-ray showed: 1. No active cardiopulmonary disease.  2. Question new 5.6 mm nodule in the left mid lung. Further  evaluation with a chest CT on outpatient basis is recommended.   5:02 PM 2 calls were placed to sign out at [phone] no response, voicemail is not set up. Patient continue to be anxious was given 0.5 of ativan, due to klonopin not available.   After extensively looking over patient's chart I see an office visit from March 21, 2019, patient was recently diagnosed with dementia with behavioral disturbance, he was taking clonazepam nightly, reports she was sleeping grade as she was taking her medication.  Patient reports she is currently not taking this medication.  Unsure whether patient had this medication discontinue.  During that visit patient was told that  she was excited about taking a picture with her golf bodies, then reported she did  not take a picture and that was not her who told her CMA this.  I have discussed this patient with Dr. Pilar PlateBero, he has also seen the patient and agrees with management as appropriate.  We feel CT could be obtained in an outpatient basis, she was informed of this.  Patient was informed of her results, she is not tachycardic, stating at 100% on room air, no respiratory distress.  Dr. Matt HolmesBaer has discussed this with patient.  Patient is appropriate for discharge home.  We will have her follow-up with PCP.  She will also be given a discharge papers which recommend that she restart Klonopin therapy as she seemed very anxious during whole evaluation.  Return precautions discussed at length, unsuccessful attempt of reaching son.    Portions of this note were generated with Scientist, clinical (histocompatibility and immunogenetics)Dragon dictation software. Dictation errors may occur despite best attempts at proofreading.    Final Clinical Impressions(s) / ED Diagnoses   Final diagnoses:  Shortness of breath  Anxiety    ED Discharge Orders    None       Claude MangesSoto, Jodene Polyak, PA-C 05/04/19 1722    Sabas SousBero, Michael M, MD 05/04/19 2015

## 2019-05-04 NOTE — Discharge Instructions (Addendum)
Your laboratory results were within normal limits. Your chest xray showed a pulmonary nodule, further evaluation with a CT should be scheduled on a outpatient basis. You received 0.5 of ativan while in the ED, we recommended restarting klonopin therapy.

## 2019-05-04 NOTE — ED Notes (Signed)
ED Provider at bedside. 

## 2019-05-04 NOTE — ED Notes (Signed)
Patient transported to X-ray 

## 2019-05-04 NOTE — ED Notes (Signed)
Pt on monitor 

## 2019-05-04 NOTE — ED Notes (Signed)
Notified Son Freida Busman of pts condition and discharge instructions

## 2019-05-04 NOTE — ED Triage Notes (Addendum)
Arrived via EMS , from [facility], walked to mailbox ,  received a bill and became anxious, VVS, c/o of SOB, non-productive cough x 2 hrs . Informed triage nurse, " I was just here and I don't need all that stuff done again" Used albuterol inhaler PTA

## 2020-02-18 IMAGING — CR CHEST - 2 VIEW
2 series · 2 of 2 positions shown · non-contrast
Comparison: 09/03/2015

CLINICAL DATA: Shortness of breath.

EXAM:
CHEST - 2 VIEW

[chest ap]
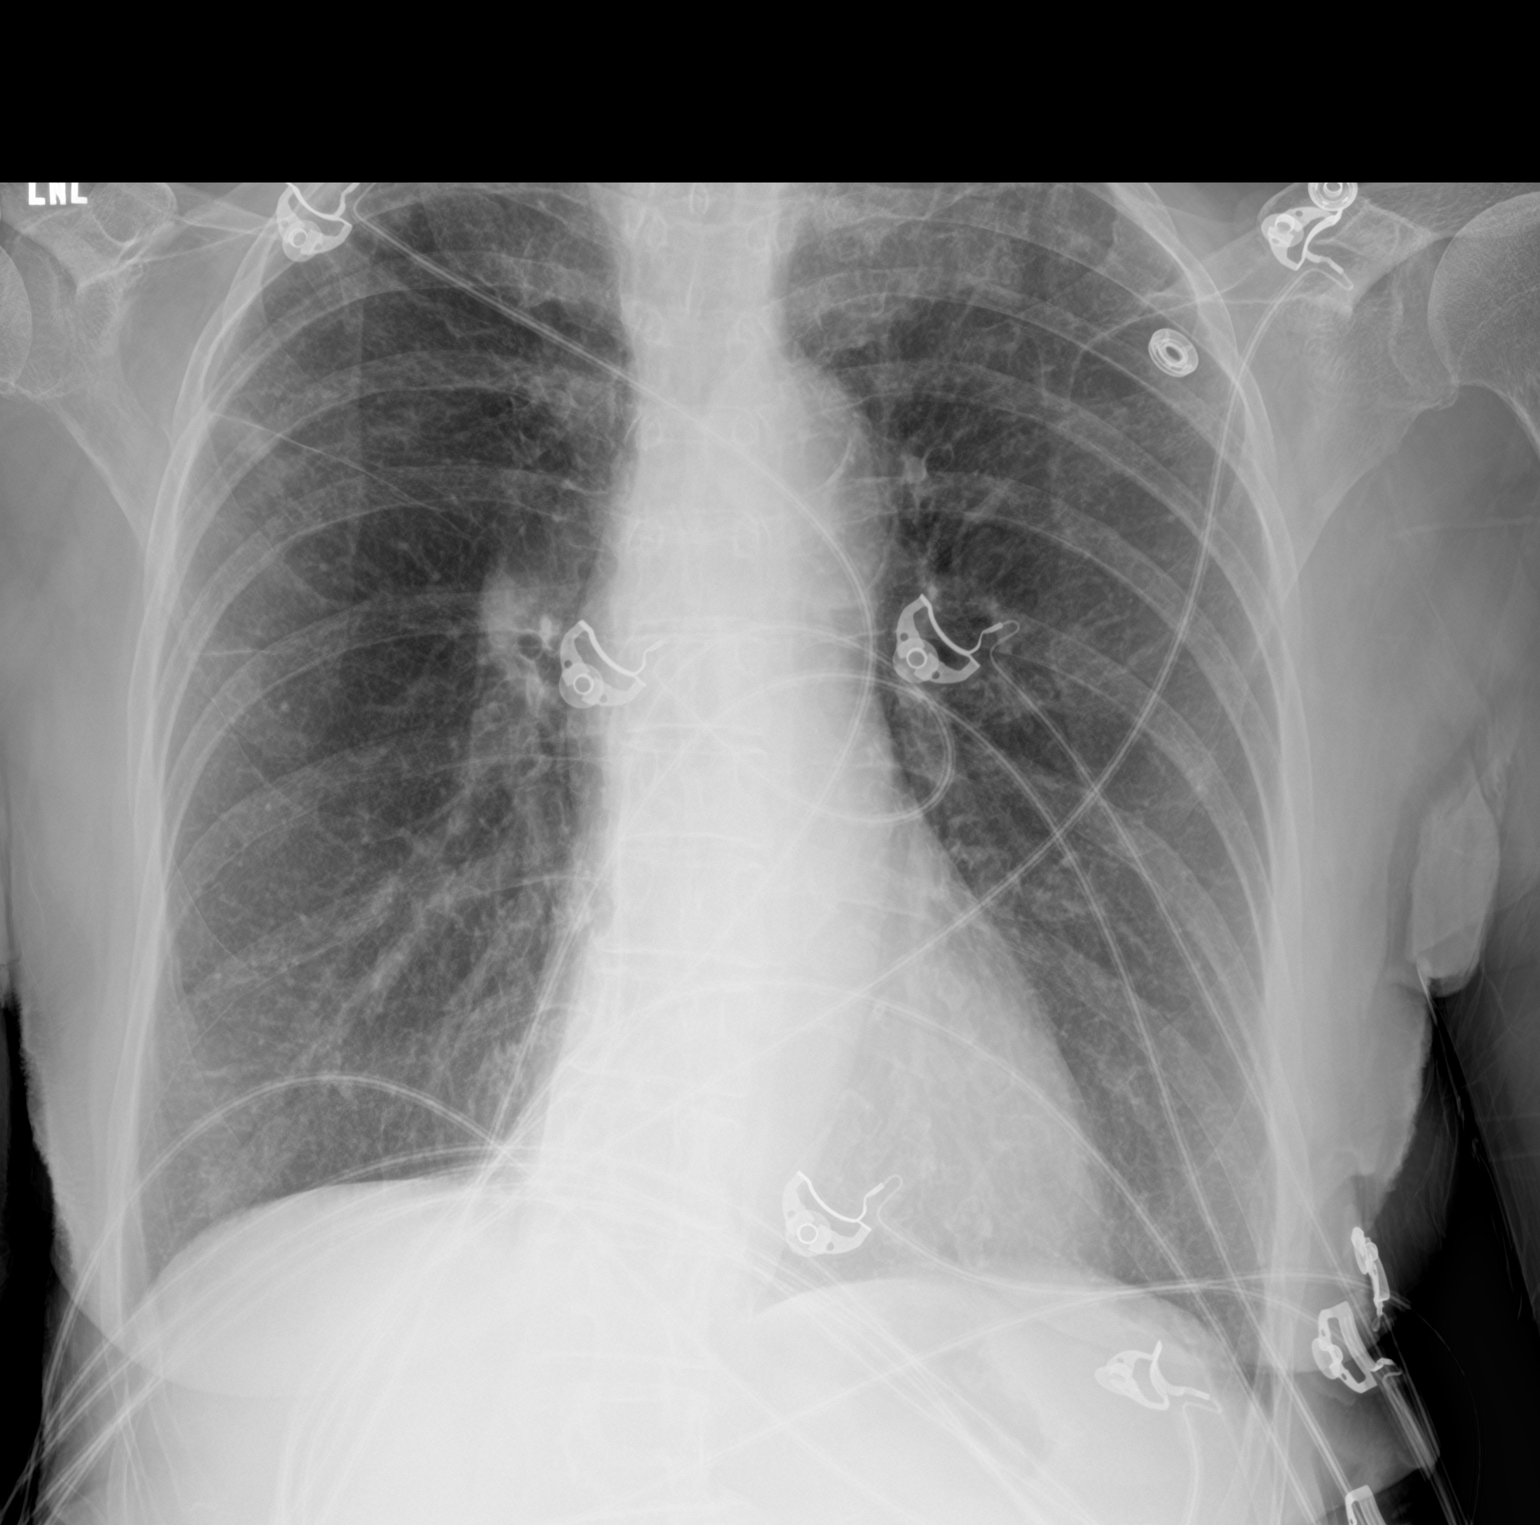

[chest lat]
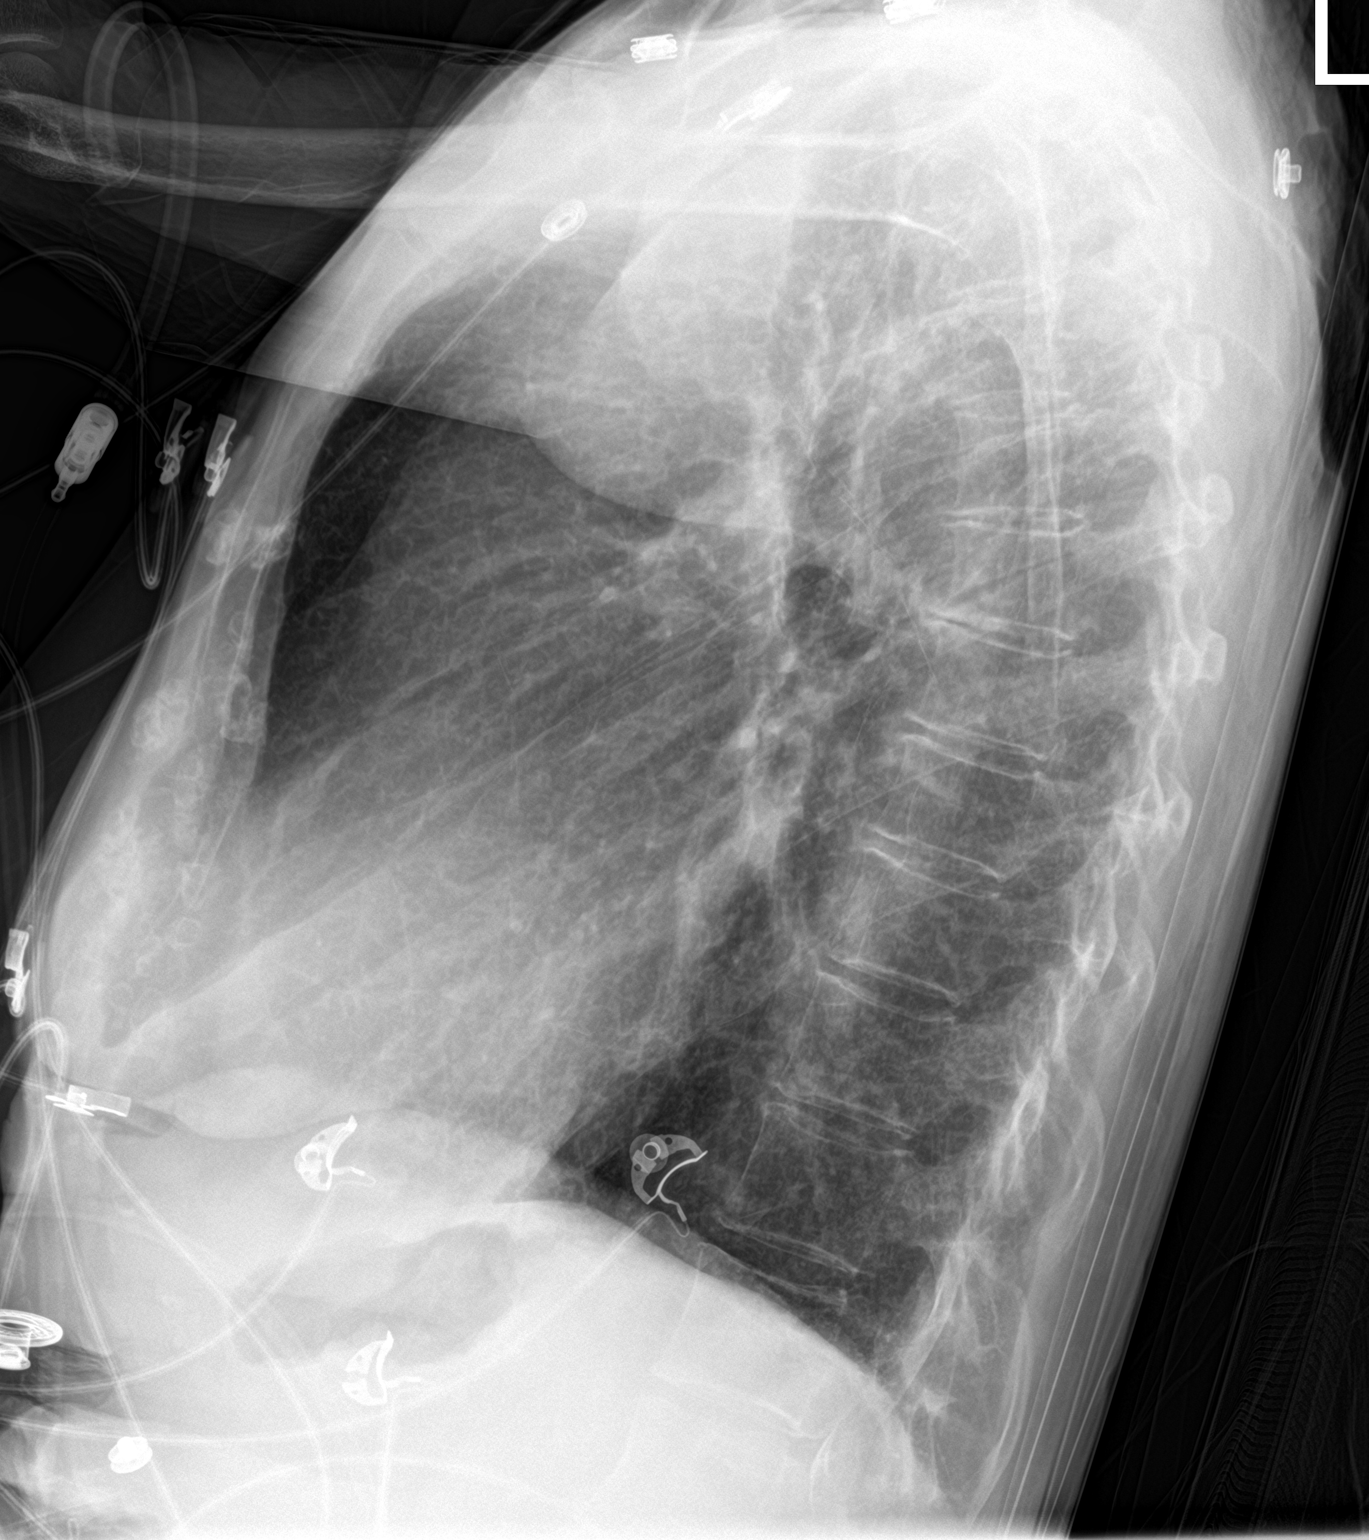

[2 of 2 positions shown; findings below may reference images not displayed]

FINDINGS: The cardiomediastinal silhouette is unchanged with normal heart
size. Aortic atherosclerosis is noted. The lungs remain
hyperinflated with unchanged scarring in the apices. Right upper
lobe volume loss is again seen. No acute airspace consolidation,
edema, pleural effusion, pneumothorax is identified. There is mild
thoracic levoscoliosis.
IMPRESSION: Chronic changes without evidence of active cardiopulmonary disease.

## 2023-06-05 ENCOUNTER — Other Ambulatory Visit: Payer: Self-pay

## 2023-06-05 ENCOUNTER — Emergency Department (HOSPITAL_COMMUNITY)
Admission: EM | Admit: 2023-06-05 | Discharge: 2023-06-06 | Disposition: A | Discharge status: Home / Self Care | Payer: Medicare Other | Attending: Emergency Medicine | Admitting: Emergency Medicine

## 2023-06-05 ENCOUNTER — Emergency Department (HOSPITAL_COMMUNITY): Payer: Medicare Other

## 2023-06-05 ENCOUNTER — Encounter (HOSPITAL_COMMUNITY): Payer: Self-pay

## 2023-06-05 DIAGNOSIS — M25512 Pain in left shoulder: Secondary | ICD-10-CM

## 2023-06-05 MED ORDER — HYDROCODONE-ACETAMINOPHEN 5-325 MG PO TABS
1.0000 | ORAL_TABLET | Freq: Once | ORAL | Status: AC
  Administered 2023-06-05: 1 via ORAL
  Filled 2023-06-05: qty 1

## 2023-06-05 MED ORDER — LIDOCAINE 5 % EX PTCH
1.0000 | MEDICATED_PATCH | CUTANEOUS | Status: DC
  Administered 2023-06-05: 1 via TRANSDERMAL
  Filled 2023-06-05: qty 1

## 2023-06-05 NOTE — ED Triage Notes (Signed)
Pt BIB EMS with reports of left shoulder pain since this morning. Pt was playing volleyball on Monday. Pt denies injuries. Pt has a large bruise and swelling to the left shoulder.  100 mcg Fentanyl given.  20 g right forearm

## 2023-06-05 NOTE — ED Provider Notes (Signed)
[place] EMERGENCY DEPARTMENT AT [facility] Provider Note   CSN: [id number] Arrival date & time: 06/05/23  2215     History {Add pertinent medical, surgical, social history, OB history to HPI:1} Chief Complaint  Patient presents with   Shoulder Pain    Gabriella Shaw is a 89 y.o. female.  89 year old female with history of atrial fibrillation on Xarelto who presents emergency department with left shoulder pain.  Patient reports that she was playing volleyball Monday and was feeling well.  Says that she does not think she injured her shoulder doing that but that yesterday started to feel sore and noticed a bruise on her arm.  Today at 5 PM she said that the pain acutely worsened and is too painful to move her arm.  No other trauma.  Denies any concerns for abuse.  Lives at [facility] facility.       Home Medications Prior to Admission medications   Medication Sig Start Date End Date Taking? Authorizing Provider  Ascorbic Acid (VITAMIN C PO) Take 1 tablet by mouth daily.    [provider]  atorvastatin (LIPITOR) 40 MG tablet Take 1 tablet (40 mg total) by mouth daily at 6 PM. 09/05/15   Joseph Art, DO  Cholecalciferol (VITAMIN D PO) Take by mouth.    [provider]  Cyanocobalamin (VITAMIN B 12 PO) Take 1 tablet by mouth daily.    [provider]  digoxin (LANOXIN) 0.125 MG tablet Take 0.125 mg by mouth daily.    [provider]  glucosamine-chondroitin 500-400 MG tablet Take 1 tablet by mouth 3 (three) times daily.    [provider]  ipratropium (ATROVENT) 0.03 % nasal spray Place 2 sprays into both nostrils every 12 (twelve) hours.    [provider]  levothyroxine (SYNTHROID, LEVOTHROID) 75 MCG tablet Take 75 mcg by mouth daily before breakfast.    [provider]  metoprolol tartrate (LOPRESSOR) 25 MG tablet Take 37.5 mg by mouth 2 (two) times daily.     [provider]  omega-3 acid  ethyl esters (LOVAZA) 1 G capsule Take by mouth 2 (two) times daily.    [provider]  Rivaroxaban (XARELTO) 15 MG TABS tablet Take 1 tablet (15 mg total) by mouth daily with supper. 09/05/15   Joseph Art, DO  VITAMIN A PO Take 1 tablet by mouth daily.    [provider]  VITAMIN E PO Take 1 tablet by mouth daily.    [provider]  zolpidem (AMBIEN) 5 MG tablet Take 2.5 mg by mouth at bedtime.    [provider]      Allergies    Patient has no known allergies.    Review of Systems   Review of Systems  Physical Exam Updated Vital Signs BP (!) 128/55 (BP Location: Right Arm)   Pulse 74   Temp (!) 97.5 F (36.4 C) (Oral)   Resp 18   SpO2 91%  Physical Exam Vitals and nursing note reviewed.  Constitutional:      General: She is not in acute distress.    Appearance: She is well-developed.  HENT:     Head: Normocephalic and atraumatic.     Right Ear: External ear normal.     Left Ear: External ear normal.     Nose: Nose normal.  Eyes:     Extraocular Movements: Extraocular movements intact.     Conjunctiva/sclera: Conjunctivae normal.     Pupils: Pupils  are equal, round, and reactive to light.  Pulmonary:     Effort: Pulmonary effort is normal. No respiratory distress.  Abdominal:     General: Abdomen is flat. There is no distension.     Palpations: Abdomen is soft. There is no mass.     Tenderness: There is no abdominal tenderness. There is no guarding.  Musculoskeletal:     Cervical back: Normal range of motion and neck supple.     Right lower leg: No edema.     Left lower leg: No edema.     Comments: Significant bruising and hematoma to left shoulder.  This limits range of motion of the left shoulder.    Symmetrically palpable radial and ulnar pulses. Capillary refill <2 seconds to all digits.  Intact sensation to light touch of the radial, median and ulnar nerves demonstrated by testing in the dorsal web space of the thumb,  the hypothenar eminence of the palm, and the radial aspect of the dorsum of the hand.  Intact motor function of the radial, median and ulnar nerves demonstrated by strength of hand grip, and spreading of the 2nd through 5th digits, thumb apposition, and ability to make "OK sign".  No snuffbox or other bony TTP of upper extremity.    Skin:    General: Skin is warm and dry.     Comments: No bruises noted elsewhere on chest, back, abdomen or torso.  No lower extremity bruising on mid thighs down to the ankles.  No additional bruising to right arm.  Neurological:     Mental Status: She is alert and oriented to person, place, and time. Mental status is at baseline.  Psychiatric:        Mood and Affect: Mood normal.     ED Results / Procedures / Treatments   Labs (all labs ordered are listed, but only abnormal results are displayed) Labs Reviewed - No data to display  EKG None  Radiology No results found.  Procedures Procedures  {Document cardiac monitor, telemetry assessment procedure when appropriate:1}  Medications Ordered in ED Medications  HYDROcodone-acetaminophen (NORCO/VICODIN) 5-325 MG per tablet 1 tablet (has no administration in time range)  lidocaine (LIDODERM) 5 % 1 patch (has no administration in time range)    ED Course/ Medical Decision Making/ A&P   {   Click here for ABCD2, HEART and other calculatorsREFRESH Note before signing :1}                          Medical Decision Making Amount and/or Complexity of Data Reviewed Radiology: ordered.  Risk Prescription drug management.   ***  {Document critical care time when appropriate:1} {Document review of labs and clinical decision tools ie heart score, Chads2Vasc2 etc:1}  {Document your independent review of radiology images, and any outside records:1} {Document your discussion with family members, caretakers, and with consultants:1} {Document social determinants of health affecting pt's  care:1} {Document your decision making why or why not admission, treatments were needed:1} Final Clinical Impression(s) / ED Diagnoses Final diagnoses:  None    Rx / DC Orders ED Discharge Orders     None

## 2023-06-06 MED ORDER — OXYCODONE HCL 5 MG PO TABS: 2.5000 mg | ORAL_TABLET | ORAL | 0 refills | Status: DC | PRN

## 2023-06-06 NOTE — ED Notes (Signed)
[facility] contacted for transport back to [facility] facility. Pt waiting in stable condition at this time

## 2023-06-06 NOTE — ED Notes (Addendum)
Discharge papers reviewed with pt, facility contacted. Pt left ED in stable condition

## 2023-06-06 NOTE — ED Notes (Signed)
Left arm sling applied to LUE as ordered by the provider.

## 2023-06-06 NOTE — Discharge Instructions (Signed)
You were seen for your shoulder injury in the emergency department.   At home, please use the sling until you are able to follow-up with orthopedics in clinic.    Check your MyChart online for the results of any tests that had not resulted by the time you left the emergency department.   Follow-up with an orthopedic doctor in 2-3 days regarding your visit.    Return immediately to the emergency department if you experience any of the following: Numbness or weakness of your hand, or any other concerning symptoms.    Thank you for visiting our Emergency Department. It was a pleasure taking care of you today.

## 2024-06-03 ENCOUNTER — Encounter (HOSPITAL_COMMUNITY): Payer: Self-pay | Admitting: Internal Medicine

## 2024-06-03 ENCOUNTER — Observation Stay (HOSPITAL_COMMUNITY)

## 2024-06-03 ENCOUNTER — Emergency Department (HOSPITAL_COMMUNITY)

## 2024-06-03 ENCOUNTER — Observation Stay (HOSPITAL_COMMUNITY)
Admission: EM | Admit: 2024-06-03 | Discharge: 2024-06-04 | Disposition: A | Discharge status: Home / Self Care | Attending: Internal Medicine | Admitting: Internal Medicine

## 2024-06-03 ENCOUNTER — Other Ambulatory Visit: Payer: Self-pay

## 2024-06-03 DIAGNOSIS — I700 Atherosclerosis of aorta: Secondary | ICD-10-CM | POA: Insufficient documentation

## 2024-06-03 DIAGNOSIS — I4891 Unspecified atrial fibrillation: Secondary | ICD-10-CM | POA: Insufficient documentation

## 2024-06-03 DIAGNOSIS — F1090 Alcohol use, unspecified, uncomplicated: Secondary | ICD-10-CM | POA: Insufficient documentation

## 2024-06-03 DIAGNOSIS — S0181XA Laceration without foreign body of other part of head, initial encounter: Secondary | ICD-10-CM | POA: Diagnosis not present

## 2024-06-03 DIAGNOSIS — Z23 Encounter for immunization: Secondary | ICD-10-CM | POA: Insufficient documentation

## 2024-06-03 DIAGNOSIS — Z7901 Long term (current) use of anticoagulants: Secondary | ICD-10-CM | POA: Insufficient documentation

## 2024-06-03 DIAGNOSIS — E039 Hypothyroidism, unspecified: Secondary | ICD-10-CM | POA: Diagnosis not present

## 2024-06-03 DIAGNOSIS — F17200 Nicotine dependence, unspecified, uncomplicated: Secondary | ICD-10-CM | POA: Diagnosis not present

## 2024-06-03 DIAGNOSIS — I10 Essential (primary) hypertension: Secondary | ICD-10-CM | POA: Diagnosis not present

## 2024-06-03 DIAGNOSIS — Z9181 History of falling: Secondary | ICD-10-CM | POA: Insufficient documentation

## 2024-06-03 DIAGNOSIS — W19XXXA Unspecified fall, initial encounter: Principal | ICD-10-CM | POA: Insufficient documentation

## 2024-06-03 DIAGNOSIS — M6281 Muscle weakness (generalized): Secondary | ICD-10-CM | POA: Insufficient documentation

## 2024-06-03 DIAGNOSIS — M47814 Spondylosis without myelopathy or radiculopathy, thoracic region: Secondary | ICD-10-CM | POA: Insufficient documentation

## 2024-06-03 DIAGNOSIS — R296 Repeated falls: Secondary | ICD-10-CM

## 2024-06-03 DIAGNOSIS — K5730 Diverticulosis of large intestine without perforation or abscess without bleeding: Secondary | ICD-10-CM | POA: Diagnosis not present

## 2024-06-03 DIAGNOSIS — R6884 Jaw pain: Secondary | ICD-10-CM | POA: Diagnosis present

## 2024-06-03 DIAGNOSIS — K625 Hemorrhage of anus and rectum: Secondary | ICD-10-CM | POA: Diagnosis not present

## 2024-06-03 DIAGNOSIS — R2681 Unsteadiness on feet: Secondary | ICD-10-CM | POA: Insufficient documentation

## 2024-06-03 LAB — URINALYSIS, ROUTINE W REFLEX MICROSCOPIC
Bilirubin Urine: NEGATIVE
Glucose, UA: NEGATIVE mg/dL
Ketones, ur: NEGATIVE mg/dL
Nitrite: NEGATIVE
Protein, ur: 30 mg/dL — AB
RBC / HPF: >50 RBC/hpf (ref 0–5)
Specific Gravity, Urine: 1.024 (ref 1.005–1.030)
pH: 7 (ref 5.0–8.0)

## 2024-06-03 LAB — COMPREHENSIVE METABOLIC PANEL WITH GFR
ALT: 13 U/L (ref 0–44)
AST: 20 U/L (ref 15–41)
Albumin: 3.4 g/dL — ABNORMAL LOW (ref 3.5–5.0)
Alkaline Phosphatase: 51 U/L (ref 38–126)
Anion gap: 9 (ref 5–15)
BUN: 11 mg/dL (ref 8–23)
CO2: 23 mmol/L (ref 22–32)
Calcium: 9.2 mg/dL (ref 8.9–10.3)
Chloride: 104 mmol/L (ref 98–111)
Creatinine, Ser: 0.7 mg/dL (ref 0.44–1.00)
GFR, Estimated: >60 mL/min (ref >60)
Glucose, Bld: 108 mg/dL — ABNORMAL HIGH (ref 70–99)
Potassium: 3.8 mmol/L (ref 3.5–5.1)
Sodium: 136 mmol/L (ref 135–145)
Total Bilirubin: 0.7 mg/dL (ref 0.0–1.2)
Total Protein: 6.1 g/dL — ABNORMAL LOW (ref 6.5–8.1)

## 2024-06-03 LAB — POC OCCULT BLOOD, ED: Fecal Occult Bld: POSITIVE — AB

## 2024-06-03 LAB — CBC
HCT: 38.8 % (ref 36.0–46.0)
Hemoglobin: 13 g/dL (ref 12.0–15.0)
MCH: 32.7 pg (ref 26.0–34.0)
MCHC: 33.5 g/dL (ref 30.0–36.0)
MCV: 97.5 fL (ref 80.0–100.0)
Platelets: 518 10*3/uL — ABNORMAL HIGH (ref 150–400)
RBC: 3.98 MIL/uL (ref 3.87–5.11)
RDW: 14.3 % (ref 11.5–15.5)
WBC: 9.8 10*3/uL (ref 4.0–10.5)
nRBC: 0 % (ref 0.0–0.2)

## 2024-06-03 LAB — HEMOGLOBIN AND HEMATOCRIT, BLOOD
HCT: 35.6 % — ABNORMAL LOW (ref 36.0–46.0)
HCT: 35.9 % — ABNORMAL LOW (ref 36.0–46.0)
HCT: 38.4 % (ref 36.0–46.0)
Hemoglobin: 11.7 g/dL — ABNORMAL LOW (ref 12.0–15.0)
Hemoglobin: 12 g/dL (ref 12.0–15.0)
Hemoglobin: 12.9 g/dL (ref 12.0–15.0)

## 2024-06-03 LAB — ETHANOL: Alcohol, Ethyl (B): <15 mg/dL (ref <15)

## 2024-06-03 LAB — I-STAT CHEM 8, ED
BUN: 11 mg/dL (ref 8–23)
Calcium, Ion: 1.15 mmol/L (ref 1.15–1.40)
Chloride: 102 mmol/L (ref 98–111)
Creatinine, Ser: 0.7 mg/dL (ref 0.44–1.00)
Glucose, Bld: 106 mg/dL — ABNORMAL HIGH (ref 70–99)
HCT: 38 % (ref 36.0–46.0)
Hemoglobin: 12.9 g/dL (ref 12.0–15.0)
Potassium: 3.7 mmol/L (ref 3.5–5.1)
Sodium: 136 mmol/L (ref 135–145)
TCO2: 22 mmol/L (ref 22–32)

## 2024-06-03 LAB — PROTIME-INR
INR: 1.8 — ABNORMAL HIGH (ref 0.8–1.2)
Prothrombin Time: 21.3 s — ABNORMAL HIGH (ref 11.4–15.2)

## 2024-06-03 LAB — SAMPLE TO BLOOD BANK

## 2024-06-03 LAB — I-STAT CG4 LACTIC ACID, ED: Lactic Acid, Venous: 0.6 mmol/L (ref 0.5–1.9)

## 2024-06-03 LAB — TROPONIN I (HIGH SENSITIVITY)
Troponin I (High Sensitivity): 15 ng/L (ref <18)
Troponin I (High Sensitivity): 15 ng/L (ref <18)

## 2024-06-03 MED ORDER — FENTANYL CITRATE PF 50 MCG/ML IJ SOSY
12.5000 ug | PREFILLED_SYRINGE | Freq: Once | INTRAMUSCULAR | Status: AC
  Administered 2024-06-03: 12.5 ug via INTRAVENOUS
  Filled 2024-06-03: qty 1

## 2024-06-03 MED ORDER — ACETAMINOPHEN 325 MG PO TABS
650.0000 mg | ORAL_TABLET | Freq: Four times a day (QID) | ORAL | Status: DC | PRN
Start: 2024-06-03 — End: 2024-06-04
  Administered 2024-06-03: 650 mg via ORAL
  Filled 2024-06-03: qty 2

## 2024-06-03 MED ORDER — ATORVASTATIN CALCIUM 40 MG PO TABS
40.0000 mg | ORAL_TABLET | Freq: Every day | ORAL | Status: DC
  Administered 2024-06-03: 40 mg via ORAL
  Filled 2024-06-03: qty 1

## 2024-06-03 MED ORDER — LIDOCAINE 5 % EX PTCH
1.0000 | MEDICATED_PATCH | CUTANEOUS | Status: DC
  Administered 2024-06-03 – 2024-06-04 (×2): 1 via TRANSDERMAL
  Filled 2024-06-03 (×2): qty 1

## 2024-06-03 MED ORDER — CLONAZEPAM 0.5 MG PO TABS: 0.5000 mg | ORAL_TABLET | Freq: Every day | ORAL | Status: DC

## 2024-06-03 MED ORDER — VITAMIN D 25 MCG (1000 UNIT) PO TABS
1000.0000 [IU] | ORAL_TABLET | Freq: Every day | ORAL | Status: DC
  Administered 2024-06-03 – 2024-06-04 (×2): 1000 [IU] via ORAL
  Filled 2024-06-03 (×2): qty 1

## 2024-06-03 MED ORDER — HYDROCODONE-ACETAMINOPHEN 7.5-325 MG PO TABS: 1.0000 | ORAL_TABLET | Freq: Four times a day (QID) | ORAL | Status: DC | PRN

## 2024-06-03 MED ORDER — ONDANSETRON HCL 4 MG PO TABS: 4.0000 mg | ORAL_TABLET | Freq: Four times a day (QID) | ORAL | Status: DC | PRN

## 2024-06-03 MED ORDER — ONDANSETRON HCL 4 MG/2ML IJ SOLN: 4.0000 mg | Freq: Four times a day (QID) | INTRAMUSCULAR | Status: DC | PRN

## 2024-06-03 MED ORDER — LIDOCAINE-EPINEPHRINE-TETRACAINE (LET) TOPICAL GEL
3.0000 mL | Freq: Once | TOPICAL | Status: AC
  Administered 2024-06-03: 3 mL via TOPICAL
  Filled 2024-06-03: qty 3

## 2024-06-03 MED ORDER — BACITRACIN ZINC 500 UNIT/GM EX OINT
TOPICAL_OINTMENT | Freq: Two times a day (BID) | CUTANEOUS | Status: DC
  Administered 2024-06-03: 1 via TOPICAL
  Filled 2024-06-03: qty 0.9

## 2024-06-03 MED ORDER — VITAMIN B-12 100 MCG PO TABS
100.0000 ug | ORAL_TABLET | Freq: Every day | ORAL | Status: DC
  Administered 2024-06-03 – 2024-06-04 (×2): 100 ug via ORAL
  Filled 2024-06-03 (×2): qty 1

## 2024-06-03 MED ORDER — SODIUM CHLORIDE 0.9 % IV SOLN: INTRAVENOUS | Status: AC

## 2024-06-03 MED ORDER — LEVOTHYROXINE SODIUM 88 MCG PO TABS
88.0000 ug | ORAL_TABLET | Freq: Every day | ORAL | Status: DC
  Administered 2024-06-04: 88 ug via ORAL
  Filled 2024-06-03: qty 1

## 2024-06-03 MED ORDER — IOHEXOL 350 MG/ML SOLN
75.0000 mL | Freq: Once | INTRAVENOUS | Status: AC | PRN
  Administered 2024-06-03: 75 mL via INTRAVENOUS

## 2024-06-03 MED ORDER — LIDOCAINE-EPINEPHRINE (PF) 2 %-1:200000 IJ SOLN
20.0000 mL | Freq: Once | INTRAMUSCULAR | Status: AC
  Administered 2024-06-03: 20 mL
  Filled 2024-06-03: qty 20

## 2024-06-03 MED ORDER — MELATONIN 5 MG PO TABS: 5.0000 mg | ORAL_TABLET | Freq: Every evening | ORAL | Status: DC | PRN

## 2024-06-03 MED ORDER — VITAMIN C 500 MG PO TABS
500.0000 mg | ORAL_TABLET | Freq: Every day | ORAL | Status: DC
  Administered 2024-06-03 – 2024-06-04 (×2): 500 mg via ORAL
  Filled 2024-06-03 (×2): qty 1

## 2024-06-03 MED ORDER — POLYETHYLENE GLYCOL 3350 17 G PO PACK: 17.0000 g | PACK | Freq: Every day | ORAL | Status: DC | PRN

## 2024-06-03 MED ORDER — TETANUS-DIPHTH-ACELL PERTUSSIS 5-2.5-18.5 LF-MCG/0.5 IM SUSY
0.5000 mL | PREFILLED_SYRINGE | Freq: Once | INTRAMUSCULAR | Status: AC
  Administered 2024-06-03: 0.5 mL via INTRAMUSCULAR
  Filled 2024-06-03: qty 0.5

## 2024-06-03 MED ORDER — ACETAMINOPHEN 650 MG RE SUPP: 650.0000 mg | Freq: Four times a day (QID) | RECTAL | Status: DC | PRN

## 2024-06-03 NOTE — ED Notes (Signed)
 Pt called out stating she was having left sided chest pain but pointing to her right chest. Mild bruising starting to form on right breast with hematoma, tender to palpation; MD made aware

## 2024-06-03 NOTE — ED Notes (Signed)
 Trauma Response Nurse Documentation   Gabriella Shaw is a 89 y.o. female arriving to [facility] ED via EMS  On Xarelto  (rivaroxaban ) daily. Trauma was activated as a Level 2 by ED charge RN based on the following trauma criteria Elderly patients > 65 with head trauma on anti-coagulation (excluding ASA).  Patient cleared for CT by Dr. Alison Irvine EDP. Pt transported to CT with trauma response nurse present to monitor. RN remained with the patient throughout their absence from the department for clinical observation.   GCS 15.  History   Past Medical History:  Diagnosis Date   A-fib (HCC)    Hypertension    TIA (transient ischemic attack)      Past Surgical History:  Procedure Laterality Date   ABDOMINAL HYSTERECTOMY     APPENDECTOMY         Initial Focused Assessment (If applicable, or please see trauma documentation): Alert/oriented female presents via EMS from home after a fall earlier in the evening with chin laceration, called 911 after she noted that it was continuing to bleed. On Xarelto .  Airway patent, BS clear Chin lac bleeding uncontrolled, pressure dressing and quick clot applied GCS 15 PERRLA 2  CT's Completed:   CT Head, CT Maxillofacial, CT C-Spine, CT Chest w/ contrast, and CT abdomen/pelvis w/ contrast   Interventions:  IV start and trauma lab draw Wound care, lac repair Quick clot to wound Portable chest, pelvis and left knee XRAY CT head, maxface, C-spine, CAP EKG TDAP POC occult stool  Plan for disposition:  Admit  Consults completed:  Hospitalist paged at [phone]  Event Summary: Presents via EMS from home with c/o chin lac bleeding after a fall in the bathroom approx 6 hours PTA. Approx 1cm lac to chin bleeding profusely steady. Pressure dressing and quick clot applied with no improvement. Consulted EDP, who placed figure 8 suture with resolution of bleeding. TDAP given. POC occult stool positive. Admit to hospitalist.   MTP Summary (If applicable):  NA  Bedside handoff with ED RN Ivette Marks.    Porfirio Bollier O Cora Brierley  Trauma Response RN  Please call TRN at [phone] for further assistance.

## 2024-06-03 NOTE — ED Notes (Signed)
 Patient assisted to the bedpan and repositioned.

## 2024-06-03 NOTE — Progress Notes (Signed)
 Transition of Care [facility]) - CAGE-AID Screening   Patient Details  Name: Gabriella Shaw MRN: [id number] Date of Birth: 02/19/1927  [facility] CM/SW Contact:    Marvis Sluder, RN Phone Number: 06/03/2024, 3:35 AM   Clinical Narrative:  Reports she drinks a couple small ones of scotch every night. States she doesn't experience withdrawal symptoms if she doesn't drink. Denies drug use. Declines resources.  CAGE-AID Screening:    Have You Ever Felt You Ought to Cut Down on Your Drinking or Drug Use?: No Have People Annoyed You By Critizing Your Drinking Or Drug Use?: No Have You Felt Bad Or Guilty About Your Drinking Or Drug Use?: No Have You Ever Had a Drink or Used Drugs First Thing In The Morning to Steady Your Nerves or to Get Rid of a Hangover?: No CAGE-AID Score: 0  Substance Abuse Education Offered: No

## 2024-06-03 NOTE — ED Provider Notes (Signed)
 [place] EMERGENCY DEPARTMENT AT [facility] Provider Note   CSN: [id number] Arrival date & time: 06/03/24  0202     Patient presents with: No chief complaint on file.   Gabriella Shaw is a 89 y.o. female.   Patient with a history of atrial fibrillation on Xarelto  here with fall around 6 PM.  She was on the commode struggling to have a bowel movement and straining.  She states she started bleeding from her rectum and went to stand up and fell striking her chin on the floor.  Did not lose consciousness.  She sustained a laceration to her chin bilaterally be okay and put a Band-Aid on it.  Continues to bleed so EMS was called.  She complains of pain to her chin only.  Denies any head pain but does have some pain to her left hip and left knee.  Denies losing consciousness.  No preceding dizziness or lightheadedness.  No chest pain or shortness of breath.  Does not know if she had any black or bloody stools but says there was blood when she wiped in the toilet bowl and everything was bloody.  Never had any issues with rectal bleeding in the past.  No fever.  The history is provided by the patient.       Prior to Admission medications   Medication Sig Start Date End Date Taking? Authorizing Provider  Ascorbic Acid (VITAMIN C PO) Take 1 tablet by mouth daily.    [provider]  atorvastatin  (LIPITOR) 40 MG tablet Take 1 tablet (40 mg total) by mouth daily at 6 PM. 09/05/15   Enrigue Harvard, DO  Cholecalciferol (VITAMIN D PO) Take by mouth.    [provider]  Cyanocobalamin (VITAMIN B 12 PO) Take 1 tablet by mouth daily.    [provider]  digoxin  (LANOXIN ) 0.125 MG tablet Take 0.125 mg by mouth daily.    [provider]  glucosamine-chondroitin 500-400 MG tablet Take 1 tablet by mouth 3 (three) times daily.    [provider]  ipratropium (ATROVENT ) 0.03 % nasal spray Place 2 sprays into both nostrils every 12 (twelve) hours.     [provider]  levothyroxine  (SYNTHROID , LEVOTHROID) 75 MCG tablet Take 75 mcg by mouth daily before breakfast.    [provider]  metoprolol  tartrate (LOPRESSOR ) 25 MG tablet Take 37.5 mg by mouth 2 (two) times daily.     [provider]  omega-3 acid ethyl esters (LOVAZA ) 1 G capsule Take by mouth 2 (two) times daily.    [provider]  oxyCODONE  (ROXICODONE ) 5 MG immediate release tablet Take 0.5 tablets (2.5 mg total) by mouth every 4 (four) hours as needed for severe pain. 06/06/23   Ninetta Basket, MD  Rivaroxaban  (XARELTO ) 15 MG TABS tablet Take 1 tablet (15 mg total) by mouth daily with supper. 09/05/15   Enrigue Harvard, DO  VITAMIN A PO Take 1 tablet by mouth daily.    [provider]  VITAMIN E PO Take 1 tablet by mouth daily.    [provider]  zolpidem  (AMBIEN ) 5 MG tablet Take 2.5 mg by mouth at bedtime.    [provider]    Allergies: Patient has no known allergies.    Review of Systems  Constitutional:  Negative for activity change, appetite change and fever.  HENT:  Negative for congestion and rhinorrhea.   Respiratory:  Negative for cough, chest tightness and shortness of breath.  Cardiovascular:  Negative for chest pain.  Gastrointestinal:  Positive for blood in stool. Negative for nausea and vomiting.  Genitourinary:  Negative for dysuria and hematuria.  Musculoskeletal:  Positive for myalgias.  Skin:  Negative for rash.  Neurological:  Positive for headaches. Negative for dizziness and weakness. Light-headedness: .ro.   all other systems are negative except as noted in the HPI and PMH.   Updated Vital Signs There were no vitals taken for this visit.  Physical Exam Vitals and nursing note reviewed.  Constitutional:      General: She is not in acute distress.    Appearance: She is well-developed.  HENT:     Head: Normocephalic.     Comments: Small laceration to chin with steady oozing, no  pulsatile bleeding    Mouth/Throat:     Pharynx: No oropharyngeal exudate.   Eyes:     Conjunctiva/sclera: Conjunctivae normal.     Pupils: Pupils are equal, round, and reactive to light.   Neck:     Comments: No midline C-spine pain Cardiovascular:     Rate and Rhythm: Normal rate and regular rhythm.     Heart sounds: Normal heart sounds. No murmur heard. Pulmonary:     Effort: Pulmonary effort is normal. No respiratory distress.     Breath sounds: Normal breath sounds.     Comments: Tender right ribs without crepitus or bruising Chest:     Chest wall: Tenderness present.  Abdominal:     Palpations: Abdomen is soft.     Tenderness: There is no abdominal tenderness. There is no guarding or rebound.   Musculoskeletal:        General: Tenderness present. Normal range of motion.     Cervical back: Normal range of motion and neck supple.     Comments: Pelvis stable.  Pain with range of motion left hip. No pain with R hip flexion.  Ecchymosis left knee.   Skin:    General: Skin is warm.   Neurological:     Mental Status: She is alert and oriented to person, place, and time.     Cranial Nerves: No cranial nerve deficit.     Motor: No abnormal muscle tone.     Coordination: Coordination normal.     Comments:  5/5 strength throughout. CN 2-12 intact.Equal grip strength.   Psychiatric:        Behavior: Behavior normal.     (all labs ordered are listed, but only abnormal results are displayed) Labs Reviewed  COMPREHENSIVE METABOLIC PANEL WITH GFR - Abnormal; Notable for the following components:      Result Value   Glucose, Bld 108 (*)    Total Protein 6.1 (*)    Albumin 3.4 (*)    All other components within normal limits  CBC - Abnormal; Notable for the following components:   Platelets 518 (*)    All other components within normal limits  URINALYSIS, ROUTINE W REFLEX MICROSCOPIC - Abnormal; Notable for the following components:   APPearance HAZY (*)    Hgb urine  dipstick LARGE (*)    Protein, ur 30 (*)    Leukocytes,Ua LARGE (*)    Bacteria, UA RARE (*)    All other components within normal limits  PROTIME-INR - Abnormal; Notable for the following components:   Prothrombin Time 21.3 (*)    INR 1.8 (*)    All other components within normal limits  I-STAT CHEM 8, ED - Abnormal; Notable for the following components:   Glucose,  Bld 106 (*)    All other components within normal limits  POC OCCULT BLOOD, ED - Abnormal; Notable for the following components:   Fecal Occult Bld POSITIVE (*)    All other components within normal limits  ETHANOL  HEMOGLOBIN AND HEMATOCRIT, BLOOD  HEMOGLOBIN AND HEMATOCRIT, BLOOD  HEMOGLOBIN AND HEMATOCRIT, BLOOD  I-STAT CG4 LACTIC ACID, ED  SAMPLE TO BLOOD BANK  TROPONIN I (HIGH SENSITIVITY)  TROPONIN I (HIGH SENSITIVITY)    EKG: EKG Interpretation Date/Time:  Friday June 03 2024 03:32:35 EDT Ventricular Rate:  80 PR Interval:  207 QRS Duration:  86 QT Interval:  453 QTC Calculation: 523 R Axis:   49  Text Interpretation: Sinus rhythm Atrial premature complex Borderline T abnormalities, inferior leads Prolonged QT interval Nonspecific T wave abnormality Confirmed by Earma Gloss [phone]) on 06/03/2024 3:37:43 AM  Radiology: CT HEAD WO CONTRAST Result Date: 06/03/2024 CLINICAL DATA:  Head trauma, moderate-severe; Facial trauma, blunt; Polytrauma, blunt EXAM: CT HEAD WITHOUT CONTRAST CT MAXILLOFACIAL WITHOUT CONTRAST CT CERVICAL SPINE WITHOUT CONTRAST TECHNIQUE: Multidetector CT imaging of the head, cervical spine, and maxillofacial structures were performed using the standard protocol without intravenous contrast. Multiplanar CT image reconstructions of the cervical spine and maxillofacial structures were also generated. RADIATION DOSE REDUCTION: This exam was performed according to the departmental dose-optimization program which includes automated exposure control, adjustment of the mA and/or kV according to  patient size and/or use of iterative reconstruction technique. COMPARISON:  None Available. FINDINGS: CT HEAD FINDINGS Brain: No evidence of acute infarction, hemorrhage, hydrocephalus, extra-axial collection or mass lesion/mass effect. Vascular: No hyperdense vessel or unexpected calcification. Skull: No acute fracture. Other: No mastoid effusions. CT MAXILLOFACIAL FINDINGS Osseous: No fracture or mandibular dislocation. No destructive process. Orbits: Negative. No traumatic or inflammatory finding. Sinuses: Right maxillary sinus mucosal thickening air-fluid level. Soft tissues: Contusion/laceration along the chin. CT CERVICAL SPINE FINDINGS Alignment: Degenerative 4 mm of anterolisthesis of C4 on C5 with severe facet arthropathy at this level. Otherwise, no substantial sagittal subluxation. Skull base and vertebrae: No evidence of acute fracture. Soft tissues and spinal canal: No prevertebral fluid or swelling. No visible canal hematoma. Disc levels: Severe multilevel degenerative change with the degenerative disease greatest at C6-C7 and facet arthropathy greatest at C4-C5. Resulting varying degrees of neural foraminal stenosis. Upper chest: Biapical pleuroparenchymal scarring. IMPRESSION: 1. No evidence of acute intracranial abnormality. 2. No evidence of acute facial fracture.  Chin contusion. 3. No evidence of acute fracture or traumatic malalignment in the cervical spine. 4. Multilevel degenerative change with 4 mm of anterolisthesis of C4 on C5. Electronically Signed   By: Stevenson Elbe M.D.   On: 06/03/2024 03:00   CT MAXILLOFACIAL WO CONTRAST Result Date: 06/03/2024 CLINICAL DATA:  Head trauma, moderate-severe; Facial trauma, blunt; Polytrauma, blunt EXAM: CT HEAD WITHOUT CONTRAST CT MAXILLOFACIAL WITHOUT CONTRAST CT CERVICAL SPINE WITHOUT CONTRAST TECHNIQUE: Multidetector CT imaging of the head, cervical spine, and maxillofacial structures were performed using the standard protocol without  intravenous contrast. Multiplanar CT image reconstructions of the cervical spine and maxillofacial structures were also generated. RADIATION DOSE REDUCTION: This exam was performed according to the departmental dose-optimization program which includes automated exposure control, adjustment of the mA and/or kV according to patient size and/or use of iterative reconstruction technique. COMPARISON:  None Available. FINDINGS: CT HEAD FINDINGS Brain: No evidence of acute infarction, hemorrhage, hydrocephalus, extra-axial collection or mass lesion/mass effect. Vascular: No hyperdense vessel or unexpected calcification. Skull: No acute fracture. Other: No mastoid effusions. CT MAXILLOFACIAL  FINDINGS Osseous: No fracture or mandibular dislocation. No destructive process. Orbits: Negative. No traumatic or inflammatory finding. Sinuses: Right maxillary sinus mucosal thickening air-fluid level. Soft tissues: Contusion/laceration along the chin. CT CERVICAL SPINE FINDINGS Alignment: Degenerative 4 mm of anterolisthesis of C4 on C5 with severe facet arthropathy at this level. Otherwise, no substantial sagittal subluxation. Skull base and vertebrae: No evidence of acute fracture. Soft tissues and spinal canal: No prevertebral fluid or swelling. No visible canal hematoma. Disc levels: Severe multilevel degenerative change with the degenerative disease greatest at C6-C7 and facet arthropathy greatest at C4-C5. Resulting varying degrees of neural foraminal stenosis. Upper chest: Biapical pleuroparenchymal scarring. IMPRESSION: 1. No evidence of acute intracranial abnormality. 2. No evidence of acute facial fracture.  Chin contusion. 3. No evidence of acute fracture or traumatic malalignment in the cervical spine. 4. Multilevel degenerative change with 4 mm of anterolisthesis of C4 on C5. Electronically Signed   By: Stevenson Elbe M.D.   On: 06/03/2024 03:00   CT CERVICAL SPINE WO CONTRAST Result Date: 06/03/2024 CLINICAL DATA:   Head trauma, moderate-severe; Facial trauma, blunt; Polytrauma, blunt EXAM: CT HEAD WITHOUT CONTRAST CT MAXILLOFACIAL WITHOUT CONTRAST CT CERVICAL SPINE WITHOUT CONTRAST TECHNIQUE: Multidetector CT imaging of the head, cervical spine, and maxillofacial structures were performed using the standard protocol without intravenous contrast. Multiplanar CT image reconstructions of the cervical spine and maxillofacial structures were also generated. RADIATION DOSE REDUCTION: This exam was performed according to the departmental dose-optimization program which includes automated exposure control, adjustment of the mA and/or kV according to patient size and/or use of iterative reconstruction technique. COMPARISON:  None Available. FINDINGS: CT HEAD FINDINGS Brain: No evidence of acute infarction, hemorrhage, hydrocephalus, extra-axial collection or mass lesion/mass effect. Vascular: No hyperdense vessel or unexpected calcification. Skull: No acute fracture. Other: No mastoid effusions. CT MAXILLOFACIAL FINDINGS Osseous: No fracture or mandibular dislocation. No destructive process. Orbits: Negative. No traumatic or inflammatory finding. Sinuses: Right maxillary sinus mucosal thickening air-fluid level. Soft tissues: Contusion/laceration along the chin. CT CERVICAL SPINE FINDINGS Alignment: Degenerative 4 mm of anterolisthesis of C4 on C5 with severe facet arthropathy at this level. Otherwise, no substantial sagittal subluxation. Skull base and vertebrae: No evidence of acute fracture. Soft tissues and spinal canal: No prevertebral fluid or swelling. No visible canal hematoma. Disc levels: Severe multilevel degenerative change with the degenerative disease greatest at C6-C7 and facet arthropathy greatest at C4-C5. Resulting varying degrees of neural foraminal stenosis. Upper chest: Biapical pleuroparenchymal scarring. IMPRESSION: 1. No evidence of acute intracranial abnormality. 2. No evidence of acute facial fracture.  Chin  contusion. 3. No evidence of acute fracture or traumatic malalignment in the cervical spine. 4. Multilevel degenerative change with 4 mm of anterolisthesis of C4 on C5. Electronically Signed   By: Stevenson Elbe M.D.   On: 06/03/2024 03:00   CT CHEST ABDOMEN PELVIS W CONTRAST Result Date: 06/03/2024 CLINICAL DATA:  Polytrauma, blunt.  Unwitnessed fall EXAM: CT CHEST, ABDOMEN, AND PELVIS WITH CONTRAST TECHNIQUE: Multidetector CT imaging of the chest, abdomen and pelvis was performed following the standard protocol during bolus administration of intravenous contrast. RADIATION DOSE REDUCTION: This exam was performed according to the departmental dose-optimization program which includes automated exposure control, adjustment of the mA and/or kV according to patient size and/or use of iterative reconstruction technique. CONTRAST:  75mL OMNIPAQUE IOHEXOL 350 MG/ML SOLN COMPARISON:  None Available. FINDINGS: CT CHEST FINDINGS Cardiovascular: No significant vascular findings. Normal heart size. No pericardial effusion. Mild aortic atherosclerotic calcification Mediastinum/Nodes: No enlarged  mediastinal, hilar, or axillary lymph nodes. Thyroid gland, trachea, and esophagus demonstrate no significant findings. Lungs/Pleura: Biapical parenchymal scarring. 3 mm pulmonary nodule, left upper lobe, (94/). Lungs are otherwise clear. No pneumothorax or pleural effusion. Musculoskeletal: No acute bone abnormality. No lytic or blastic bone lesion. Osseous structures are age appropriate. CT ABDOMEN PELVIS FINDINGS Hepatobiliary: Moderate hepatic steatosis. No enhancing intrahepatic mass. No intra or extrahepatic biliary ductal dilation. Gallbladder unremarkable. Pancreas: Unremarkable Spleen: Unremarkable Adrenals/Urinary Tract: Adrenal glands are unremarkable. Kidneys are normal, without renal calculi, focal lesion, or hydronephrosis. Bladder is unremarkable. Stomach/Bowel: Severe sigmoid diverticulosis. Stomach, small bowel,  and large bowel are otherwise unremarkable. Appendix absent. No evidence of obstruction or focal inflammation. No free intraperitoneal gas or fluid. Vascular/Lymphatic: Aortic atherosclerosis. No enlarged abdominal or pelvic lymph nodes. Reproductive: Status post hysterectomy. No adnexal masses. Other: No abdominal wall hernia or abnormality. No abdominopelvic ascites. Musculoskeletal: No acute bone abnormality. No lytic or blastic bone lesion. Osseous structures are age appropriate. Moderate lumbar dextroscoliosis noted. Advanced degenerative changes are noted within the hips bilaterally. IMPRESSION: 1. No acute intrathoracic or intra-abdominal pathology identified. 2. 3 mm left upper lobe pulmonary nodule. 3. Moderate hepatic steatosis. 4. Severe sigmoid diverticulosis. 5. Aortic atherosclerosis. Aortic Atherosclerosis (ICD10-I70.0). Electronically Signed   By: Worthy Heads M.D.   On: 06/03/2024 02:59   DG Knee Left Port Result Date: 06/03/2024 EXAM: 2 VIEW(S) XRAY OF THE LEFT KNEE 06/03/2024 02:31:00 AM COMPARISON: None available. CLINICAL HISTORY: Blunt Trauma. Reason for exam: level 2 fall on blood thinners, pt loss balance getting up from toilet FINDINGS: BONES AND JOINTS: No acute fracture. No focal osseous lesion. No joint dislocation. No significant joint effusion. Moderate tricompartmental degenerative changes, most prominent in the lateral compartment, with chondrocalcinosis. SOFT TISSUES: The soft tissues are unremarkable. IMPRESSION: 1. No acute fracture or dislocation. 2. Moderate tricompartmental degenerative changes. Electronically signed by: Zadie Herter MD 06/03/2024 02:38 AM EDT RP Workstation: [id number]   DG Pelvis Portable Result Date: 06/03/2024 EXAM: 2 VIEW(S) XRAY OF THE PELVIS 06/03/2024 02:31:00 AM COMPARISON: None available. CLINICAL HISTORY: Trauma. Reason for exam: level 2 fall on blood thinners, pt loss balance getting up from toilet. FINDINGS: BONES AND JOINTS: Mild  degenerative changes of the lumbar spine with dextroscoliosis. 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 No acute fracture. No joint dislocation. SOFT TISSUES: The soft tissues are unremarkable. IMPRESSION: 1. No acute traumatic injury. 2. 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 Electronically signed by: Zadie Herter MD 06/03/2024 02:38 AM EDT RP Workstation: [id number]   DG Chest Port 1 View Result Date: 06/03/2024 EXAM: 1 VIEW XRAY OF THE CHEST 06/03/2024 02:31:00 AM COMPARISON: None available. CLINICAL HISTORY: Trauma. Reason for exam: level 2 fall on blood thinners, pt loss balance getting up from toilet. FINDINGS: LUNGS AND PLEURA: Increased interstitial markings in the upper lobes, favoring scarring over aspiration. Correlate with pending CT. No pleural effusion. No pneumothorax. HEART AND MEDIASTINUM: Thoracic aortic atherosclerosis. No acute abnormality of the cardiac and mediastinal silhouettes. BONES AND SOFT TISSUES: No acute osseous abnormality. IMPRESSION: 1. Increased interstitial markings in the upper lobes, favoring scarring over aspiration. Correlate with pending CT. Electronically signed by: Zadie Herter MD 06/03/2024 02:36 AM EDT RP Workstation: [id number]     Procedures   Medications Ordered in the ED  Tdap (BOOSTRIX) injection 0.5 mL (has no administration in time range)  lidocaine -EPINEPHrine (XYLOCAINE  W/EPI) 2 %-1:200000 (PF) injection 20 mL (has no administration  in time range)  lidocaine -EPINEPHrine-tetracaine (LET) topical gel (has no administration in time range)                                    Medical Decision Making Amount and/or Complexity of Data Reviewed Independent Historian: EMS Labs: ordered. Decision-making details documented in ED Course. Radiology: ordered and independent interpretation performed. Decision-making details documented in ED  Course. ECG/medicine tests: ordered and independent interpretation performed. Decision-making details documented in ED Course.  Risk Prescription drug management. Decision regarding hospitalization.   Fall with chin laceration.  Stable vitals on arrival.  GCS is 15, ABCs are intact  Stable vitals.  GCS is 15, ABCs are intact.  Complains of pain to her chin, left knee and left hip.  Denies any chest pain or abdominal pain.  Did have some rectal bleeding earlier while she was on the commode but does not know how much this was.  She does take Xarelto .  Left knee and hip x-ray are negative for fracture.  Chest x-ray shows possibility of aspiration.  Hemoglobin stable at 13.  No hypoxia or increased work of breathing.  Hemoccult is positive  CT head, C-spine, max face are negative for intracranial injury or facial fracture.  C-spine is negative for fracture.  CT abdomen pelvis does show diverticulosis which likely explains her rectal bleeding.  She is in no respiratory distress.  Stable vital signs and stable hemoglobin.  However given her anticoagulation use will monitor overnight for her rectal bleeding. \ Vital signs remained stable.  No further episodes of rectal bleeding.  Message sent to gastroenterology Dr. General Kenner.  Hold Xarelto  currently. Chest pain likely secondary to chest wall contusion.  EKG without acute ischemia.  Troponin negative.  Traumatic imaging as above is reassuring.  No fractures or intracranial pathology seen. Patient for observation discussed with Dr. [facility] Lavender     Final diagnoses:  None    ED Discharge Orders     None          Baudelio Karnes, Mara [place], MD 06/03/24 [phone]

## 2024-06-03 NOTE — ED Triage Notes (Signed)
 PT BIB EMS FROM HOME WITH C/O [facility] ABOUT 6 HOURS AGO. PT STATES SHE WENT TO BED AND WOKE UP STILL BLEEDING FROM A LAC TO HER CHIN. PT DENIES LOC, ADVISES SHE IS ON BLOOD THINNERS.

## 2024-06-03 NOTE — ED Provider Notes (Signed)
  Physical Exam  BP 114/81   Physical Exam  Procedures  .Laceration Repair  Date/Time: 06/03/2024 2:58 AM  Performed by: Elisa Guest, PA-C Authorized by: Elisa Guest, PA-C   Consent:    Consent obtained:  Verbal   Consent given by:  Patient   Risks, benefits, and alternatives were discussed: yes   Universal protocol:    Patient identity confirmed:  Arm band Anesthesia:    Anesthesia method:  Local infiltration   Local anesthetic:  Lidocaine  2% WITH epi Laceration details:    Location:  Face   Face location:  Chin   Length (cm):  1 Pre-procedure details:    Preparation:  Patient was prepped and draped in usual sterile fashion Exploration:    Hemostasis achieved with:  Epinephrine, LET and tied off vessels Treatment:    Area cleansed with:  Povidone-iodine   Amount of cleaning:  Standard Skin repair:    Repair method:  Sutures   Suture size:  5-0   Wound skin closure material used: Monocryl.   Suture technique:  Figure eight   Number of sutures:  1 Approximation:    Approximation:  Close Repair type:    Repair type:  Simple Post-procedure details:    Dressing:  Non-adherent dressing   Procedure completion:  Tolerated well, no immediate complications   ED Course / MDM    Medical Decision Making Amount and/or Complexity of Data Reviewed Labs: ordered. Radiology: ordered.  Risk Prescription drug management.         Gabriella Shaw 06/03/24 0259    Earma Gloss, MD 06/03/24 [phone]

## 2024-06-03 NOTE — Hospital Course (Addendum)
 89 yo female admitted after fall in the bathroom.  She denies loss of consciousness and remembers the fall.  States that she was self disimpacting and had a few drops of red blood afterwards.  Upon standing up she lost balance and fell forward on the floor hitting her right side.  In trying to get up she hit her chin at some point on the floor causing a laceration. She required assistance getting to the bathroom while in the hospital therefore PT/OT have been consulted. Blood pressure and hemoglobin have been stable.   No plans for intervention per GI, agree with this also. Seems the blood was from straining and disimpaction.    Xarelto  held on admission.  Hemoglobin remained stable, 11.6 g/dL at discharge.  ER did place 1 suture on her chin on 06/03/2024.  This will need to be removed or inspected by 06/08/2024.  Orthostatic blood pressure checked prior to discharge and negative.  Echo was obtained and pending at time of discharge.  Of note, given advanced age, would be cautious continuing with prescribing benzos.  Concerned that this would contribute to her falling especially in the evenings.

## 2024-06-03 NOTE — Consult Note (Addendum)
 Attending physician's note   I have taken a history, reviewed the chart, and examined the patient. I performed a substantive portion of this encounter, including complete performance of at least one of the key components, in conjunction with the APP. I agree with the APP's note, impression, and recommendations with my edits.   89 year old female with medical history as outlined below, presents with fall at home with chin laceration.  During intake evaluation, she reports she was in the bathroom, did have straining to have BM and noted BRB on tissue paper and some drips of BRB on the bathroom floor.  No prior history of bleeding.  Does not have a history of constipation.  She then fell in the bathroom and hit her chin.  Did not have lightheadedness, CP, SOB, syncope and no LOC.  Admission evaluation notable for the following: - H/H 13/38.8 with MCV/RDW 97.5/14 - INR 1.8 - Lactate normal - CT C/A/P: Hepatic steatosis, severe sigmoid diverticulosis, no obstruction or inflammatory changes of the GI tract CT head: No acute intracranial pathology  1) Hematochezia Suspect mild, self-limiting bleed secondary to hemorrhoids which were exacerbated during straining to have BM and attempt at manual disimpaction by patient at home.  She otherwise does not have any history of constipation.  H/H on arrival normal and at baseline. - Trend CBCs while in house - Based on suspected benign anal rectal etiology for bleeding, advanced age, and strong patient wishes to avoid endoscopic evaluation unless absolutely necessary, no plan for colonoscopy at this juncture - Holding Xarelto   2) Atrial fibrillation - Xarelto  on hold  3) Fall at home 4) Chin laceration - Management per ER and admitting Medicine services  GI service will remain available as needed.  Please do not hesitate to contact us  with additional questions or concerns  Harry Lindau, DO, FACG [phone] office          Consultation  Referring Provider: [facility]/ [facility] Primary Care Physician:  Pcp, No Primary Gastroenterologist:unassigned  Reason for Consultation: Rectal bleeding  HPI: Gabriella Shaw is a 89 y.o. female resident of [facility], generally very active with history of atrial fibrillation on chronic Xarelto , hypertension, hypothyroidism, previous TIA and anxiety.  Patient says that she was in the bathroom straining to have a bowel movement which felt larger than normal, and would not move.  She says she used her finger to try to help the stool out.  When she finished she noted bright red blood on the tissue and is not sure whether there was blood in the commode but noted blood dripping on the bathroom floor.  While she was in the bathroom she says she fell and hit her chin on the floor.  She does not recall being lightheaded or dizzy, just that her legs felt weak and she fell but did not pass out.  She cleaned up the bathroom, called 911 because her chin was bleeding.  Found to be hemodynamically stable and brought to the emergency room. She says she has not been having any difficulty with constipation and her bowels are usually very regularly and she has bowel movements daily.  No change in that pattern over the past couple weeks, no complaints of abdominal pain.  No rectal pain or discomfort.  She has not had any nausea or vomiting. No prior GI history.  On arrival to the emergency room has been hemodynamically stable blood pressure 114/80 heart rate in the 80s  Labs early this a.m. with  WBC of 9.8/hemoglobin 13/hematocrit 38.8/platelets 518 Pro time 21.3/INR 1.8 Lactate 0.6/troponin within normal limit UA is positive/culture pending Sodium 136/potassium 3.8/BUN 11/creatinine 0.70 Albumin 3.4 LFTs within normal limits Stool documented heme positive  Due to fall, she has undergone extensive CT imaging including CT of the chest abdomen and pelvis-finding of 3 mm pulmonary nodule left  upper lobe, moderate hepatic steatosis, normal-appearing gallbladder, severe sigmoid diverticulosis otherwise unremarkable bowel  Review of care everywhere shows she has been seen by [facility] gastroenterology remotely 2007, colonoscopy was discussed but not done at that time and per those notes had negative colonoscopy about 6 years previous.   Past Medical History:  Diagnosis Date   A-fib (HCC)    Hypertension    TIA (transient ischemic attack)     Past Surgical History:  Procedure Laterality Date   ABDOMINAL HYSTERECTOMY     APPENDECTOMY      Prior to Admission medications   Medication Sig Start Date End Date Taking? Authorizing Provider  Ascorbic Acid (VITAMIN C PO) Take 1 tablet by mouth daily.   Yes [provider]  atorvastatin  (LIPITOR) 40 MG tablet Take 1 tablet (40 mg total) by mouth daily at 6 PM. 09/05/15  Yes Vann, Jessica U, DO  Cholecalciferol (VITAMIN D PO) Take 1 tablet by mouth daily.   Yes [provider]  clonazePAM  (KLONOPIN ) 0.5 MG tablet Take 0.5 mg by mouth at bedtime.   Yes [provider]  Cyanocobalamin (VITAMIN B 12 PO) Take 1 tablet by mouth daily.   Yes [provider]  Echinacea 125 MG TABS Take 1 capsule by mouth daily.   Yes [provider]  glucosamine-chondroitin 500-400 MG tablet Take 1 tablet by mouth 3 (three) times daily.   Yes [provider]  levothyroxine  (SYNTHROID ) 88 MCG tablet Take 88 mcg by mouth daily.   Yes [provider]  metoprolol  tartrate (LOPRESSOR ) 25 MG tablet Take 25 mg by mouth 2 (two) times daily.   Yes [provider]  omega-3 acid ethyl esters (LOVAZA ) 1 G capsule Take by mouth 2 (two) times daily.   Yes [provider]  Rivaroxaban  (XARELTO ) 15 MG TABS tablet Take 1 tablet (15 mg total) by mouth daily with supper. 09/05/15  Yes Vann, Jessica U, DO  VITAMIN E PO Take 1 tablet by mouth daily.   Yes [provider]  ipratropium (ATROVENT )  0.03 % nasal spray Place 2 sprays into both nostrils every 12 (twelve) hours.    [provider]  oxyCODONE  (ROXICODONE ) 5 MG immediate release tablet Take 0.5 tablets (2.5 mg total) by mouth every 4 (four) hours as needed for severe pain. 06/06/23   Ninetta Basket, MD  VITAMIN A PO Take 1 tablet by mouth daily. Patient not taking: Reported on 06/03/2024    [provider]    Current Facility-Administered Medications  Medication Dose Route Frequency Provider Last Rate Last Admin   0.9 %  sodium chloride infusion   Intravenous Continuous Foust, Katy L, NP 75 mL/hr at 06/03/24 0915 New Bag at 06/03/24 0915   acetaminophen  (TYLENOL ) tablet 650 mg  650 mg Oral Q6H PRN Foust, Katy L, NP   650 mg at 06/03/24 0908   Or   acetaminophen  (TYLENOL ) suppository 650 mg  650 mg Rectal Q6H PRN Foust, Katy L, NP       ascorbic acid (VITAMIN C) tablet 500 mg  500 mg Oral Daily Foust, Katy L, NP       atorvastatin  (  LIPITOR) tablet 40 mg  40 mg Oral q1800 Foust, Katy L, NP       bacitracin ointment   Topical BID Foust, Katy L, NP       cholecalciferol (VITAMIN D3) 25 MCG (1000 UNIT) tablet 1,000 Units  1,000 Units Oral Daily Foust, Katy L, NP       HYDROcodone -acetaminophen  (NORCO) 7.5-325 MG per tablet 1 tablet  1 tablet Oral Q6H PRN Foust, Katy L, NP       [START ON 06/04/2024] levothyroxine  (SYNTHROID ) tablet 88 mcg  88 mcg Oral Daily Foust, Katy L, NP       lidocaine  (LIDODERM ) 5 % 1 patch  1 patch Transdermal Q24H Foust, Katy L, NP   1 patch at 06/03/24 0908   ondansetron (ZOFRAN) tablet 4 mg  4 mg Oral Q6H PRN Foust, Katy L, NP       Or   ondansetron (ZOFRAN) injection 4 mg  4 mg Intravenous Q6H PRN Foust, Katy L, NP       polyethylene glycol (MIRALAX / GLYCOLAX) packet 17 g  17 g Oral Daily PRN Foust, Katy L, NP       vitamin B-12 (CYANOCOBALAMIN) tablet 100 mcg  100 mcg Oral Daily Foust, Katy L, NP       Current Outpatient Medications  Medication Sig Dispense Refill   Ascorbic Acid  (VITAMIN C PO) Take 1 tablet by mouth daily.     atorvastatin  (LIPITOR) 40 MG tablet Take 1 tablet (40 mg total) by mouth daily at 6 PM. 30 tablet 0   Cholecalciferol (VITAMIN D PO) Take 1 tablet by mouth daily.     clonazePAM  (KLONOPIN ) 0.5 MG tablet Take 0.5 mg by mouth at bedtime.     Cyanocobalamin (VITAMIN B 12 PO) Take 1 tablet by mouth daily.     Echinacea 125 MG TABS Take 1 capsule by mouth daily.     glucosamine-chondroitin 500-400 MG tablet Take 1 tablet by mouth 3 (three) times daily.     levothyroxine  (SYNTHROID ) 88 MCG tablet Take 88 mcg by mouth daily.     metoprolol  tartrate (LOPRESSOR ) 25 MG tablet Take 25 mg by mouth 2 (two) times daily.     omega-3 acid ethyl esters (LOVAZA ) 1 G capsule Take by mouth 2 (two) times daily.     Rivaroxaban  (XARELTO ) 15 MG TABS tablet Take 1 tablet (15 mg total) by mouth daily with supper. 30 tablet 0   VITAMIN E PO Take 1 tablet by mouth daily.     ipratropium (ATROVENT ) 0.03 % nasal spray Place 2 sprays into both nostrils every 12 (twelve) hours.     oxyCODONE  (ROXICODONE ) 5 MG immediate release tablet Take 0.5 tablets (2.5 mg total) by mouth every 4 (four) hours as needed for severe pain. 15 tablet 0   VITAMIN A PO Take 1 tablet by mouth daily. (Patient not taking: Reported on 06/03/2024)      Allergies as of 06/03/2024   (No Known Allergies)    History reviewed. No pertinent family history.  Social History   Socioeconomic History   Marital status: Widowed    Spouse name: Not on file   Number of children: Not on file   Years of education: Not on file   Highest education level: Not on file  Occupational History   Not on file  Tobacco Use   Smoking status: Unknown   Smokeless tobacco: Former  Substance and Sexual Activity   Alcohol use: Yes    Comment: drinks  3-4 oz of scotch daily   Drug use: No   Sexual activity: Never  Other Topics Concern   Not on file  Social History Narrative   Not on file   Social Drivers of Health    Financial Resource Strain: Not on file  Food Insecurity: Not on file  Transportation Needs: Not on file  Physical Activity: Not on file  Stress: Not on file  Social Connections: Unknown (06/13/2023)   [facility]   Social Network    Social Network: Not on file  Intimate Partner Violence: Unknown (06/13/2023)   Received from [facility]   HITS    Physically Hurt: Not on file    Insult or Talk Down To: Not on file    Threaten Physical Harm: Not on file    Scream or Curse: Not on file    Review of Systems: Pertinent positive and negative review of systems were noted in the above HPI section.  All other review of systems was otherwise negative.   Physical Exam: Vital signs in last 24 hours: Temp:  [97.8 F (36.6 C)-97.9 F (36.6 C)] 97.8 F (36.6 C) (06/20 0651) Pulse Rate:  [54] 54 (06/20 0715) Resp:  [22-25] 22 (06/20 0715) BP: (114-148)/(81-83) 148/83 (06/20 0715) SpO2:  [98 %] 98 % (06/20 0715)   General:   Alert,  Well-developed, thin, very elderly white female, pleasant and cooperative in NAD Head:  Normocephalic and atraumatic. Eyes:  Sclera clear, no icterus.   Conjunctiva pink. Ears:  Normal auditory acuity. Nose:  No deformity, discharge,  or lesions. Mouth:  No deformity or lesions.  Laceration on the chin, extensive bruising around the chin Neck:  Supple; no masses or thyromegaly. Lungs:  Clear throughout to auscultation.   No wheezes, crackles, or rhonchi.  Heart:  Regular rate and rhythm; no murmurs, clicks, rubs,  or gallops. Abdomen:  Soft,nontender, BS active,nonpalp mass or hsm.,  Low vertical incisional scar and appendectomy scar Rectal: No external hemorrhoids appreciated, no anal fissure, no blood on the examining glove, palpable internal hemorrhoid, no stool in the rectal vault-also viewed stool in the commode which was brown with a small amount of mucousy heme Msk:  Symmetrical without gross deformities. . Pulses:  Normal pulses  noted. Extremities:  Without clubbing or edema. Neurologic:  Alert and  oriented x4;  grossly normal neurologically. Skin:  Intact without significant lesions or rashes.. Psych:  Alert and cooperative. Normal mood and affect.  Intake/Output from previous day: No intake/output data recorded. Intake/Output this shift: No intake/output data recorded.  Lab Results: Recent Labs    06/03/24 0218 06/03/24 0219 06/03/24 0848  WBC 9.8  --   --   HGB 13.0 12.9 11.7*  HCT 38.8 38.0 35.6*  PLT 518*  --   --    BMET Recent Labs    06/03/24 0218 06/03/24 0219  NA 136 136  K 3.8 3.7  CL 104 102  CO2 23  --   GLUCOSE 108* 106*  BUN 11 11  CREATININE 0.70 0.70  CALCIUM  9.2  --    LFT Recent Labs    06/03/24 0218  PROT 6.1*  ALBUMIN 3.4*  AST 20  ALT 13  ALKPHOS 51  BILITOT 0.7   PT/INR Recent Labs    06/03/24 0218  LABPROT 21.3*  INR 1.8*   Hepatitis Panel No results for input(s): HEPBSAG, HCVAB, HEPAIGM, HEPBIGM in the last 72 hours.    IMPRESSION:  #75 89 year old white female admitted  after fall at home in her bathroom hitting her chin on the floor, laceration requiring sutures  #2 low-grade rectal bleeding-this occurred with straining for bowel movement and attempt at manual removal of stool per patient though no history of constipation.  Hemoglobin normal at 13 on admission Witnessed bowel movement in commode which was brown with a tinge of mucus/heme Rectal exam unremarkable no external hemorrhoids no fissure, she does have palpable internal hemorrhoid, no stool in the rectal vault  Suspect low-grade rectal bleeding from internal hemorrhoid in setting of straining and patient attempting manual removal of stool in setting of Xarelto  She does not appear to be having an acute GI bleed  #3 atrial fibrillation-on Xarelto  #4 history of hypertension #5 hypothyroidism #6.  Diverticulosis  PLAN: Okay for regular diet Trend hemoglobin Okay to leave her  on Xarelto  Would not plan any further GI workup or intervention unless she would manifest active bleed, drop in hemoglobin. Plans were discussed with the patient and she is in agreement with this plan   Amy EsterwoodPA-C  06/03/2024, 9:33 AM

## 2024-06-03 NOTE — TOC Initial Note (Signed)
 Transition of Care [facility]) - Initial/Assessment Note    Patient Details  Name: Gabriella Shaw MRN: [id number] Date of Birth: March 03, 1927  Transition of Care [facility]) CM/SW Contact:    Juliane Och, LCSW Phone Number: 06/03/2024, 4:02 PM  Clinical Narrative:                  4:02 PM Patient resides at [facility]. Patient has insurance but not a PCP. Patient has SNF history with [facility]. Patient does not have HH or DME history. Patient's preferred pharmacy is [facility]. Patient is expected to return to ILF with [facility].  Expected Discharge Plan: Home w Home Health Services ([facility]) Barriers to Discharge: Continued Medical Work up   Patient Goals and CMS Choice            Expected Discharge Plan and Services   Discharge Planning Services: CM Consult Post Acute Care Choice: Home Health Living arrangements for the past 2 months: Independent Living Facility                                      Prior Living Arrangements/Services Living arrangements for the past 2 months: Independent Living Facility Lives with:: Facility Resident Patient language and need for interpreter reviewed:: Yes              Criminal Activity/Legal Involvement Pertinent to Current Situation/Hospitalization: No - Comment as needed  Activities of Daily Living      Permission Sought/Granted Permission sought to share information with : Family Supports, Oceanographer granted to share information with : No (Contact information on chart)  Share Information with NAME: Al Osbourn  Permission granted to share info w AGENCY: [facility]  Permission granted to share info w Relationship: Son  Permission granted to share info w Contact Information: [phone]  Emotional Assessment         Alcohol / Substance Use: Not Applicable Psych Involvement: No (comment)  Admission diagnosis:  Rectal bleeding [K62.5] Fall, initial  encounter [W19.XXXA] Chin laceration, initial encounter [S01.81XA] Patient Active Problem List   Diagnosis Date Noted   Rectal bleeding 06/03/2024   Recurrent falls 06/03/2024   Chin laceration 06/03/2024   TIA (transient ischemic attack) 09/03/2015   Atrial fibrillation (HCC) 09/03/2015   Essential hypertension 09/03/2015   Hypothyroidism 09/03/2015   PCP:  Pcp, No Pharmacy:   [facility] [place], [place] - [address] [place] [address] Phone: [phone] Fax: [phone]     Social Drivers of Health (SDOH) Social History: SDOH Screenings   Social Connections: Unknown (06/13/2023)   Received from [facility]  Tobacco Use: Medium Risk (06/03/2024)   SDOH Interventions:     Readmission Risk Interventions     No data to display

## 2024-06-03 NOTE — H&P (Addendum)
 History and Physical    Gabriella Shaw JYN:[id number] DOB: 1927-12-07 DOA: 06/03/2024  DOS: the patient was seen and examined on 06/03/2024  PCP: Pcp, No   Patient coming from: Home  I have personally briefly reviewed patient's old medical records in [facility] and CareEverywhere  HPI:   Gabriella Shaw is a 89 y.o. year old female with past medical history of  atrial fibrillation on Xarelto , hypertension, hypothyroidism, TIA, dementia, insomnia, and anxiety.  She presents to [facility] ED after a fall yesterday evening around 6 PM.  The patient reports she was on the toilet and straining to have a bowel movement ultimately requiring her to manually disimpact herself.  She notes that there were a few drops of bright red blood in the toilet bowl, denies a history of hemorrhoids or rectal bleeding in the past. Upon standing she fell and hit her chin on the floor.  She put a Band-Aid on her chin however she continued to bleed so she called 911.  At time of ED presentation her chief complaint was chin pain and bleeding.  She denies dizziness, lightheadedness, any change in sensorium, chest pain, palpitations, nausea, vomiting before or after her fall.  She states that she felt her normal self prior to and after the fall besides the pain.  On my interview she is also endorsing right chest wall tenderness which is reproducible on exam.  Additionally, she also endorses recurrent falls but again states the falls are not preceded by any dizziness, lightheadedness, or change in sensorium.  ED Course: On arrival to [facility] ED patient was noted to be afebrile temp 36.6C, BP  114/81, HR 84, RR 25, SpO2 90% on room air.  Labs notable for positive fecal occult blood, platelets 518, total protein 6.1, and troponin pending.  UA leukocyte positive with WBCs, and rare bacteria.  Patient totally asymptomatic and denies dysuria.  In the ED she was given fentanyl, lidocaine  with epi.  During laceration  repair, and Tdap.  EDP messaged GI Dr. General Kenner regarding rectal bleeding and [facility] contacted for admission.  Review of Systems: As mentioned in the history of present illness. All other systems reviewed and are negative.  Past Medical History:  Diagnosis Date   A-fib (HCC)    Hypertension    TIA (transient ischemic attack)     Past Surgical History:  Procedure Laterality Date   ABDOMINAL HYSTERECTOMY     APPENDECTOMY       reports that she has an unknown smoking status. She has quit using smokeless tobacco. She reports current alcohol use. She reports that she does not use drugs.  No Known Allergies  History reviewed. No pertinent family history.  Prior to Admission medications   Medication Sig Start Date End Date Taking? Authorizing Provider  Ascorbic Acid (VITAMIN C PO) Take 1 tablet by mouth daily.   Yes [provider]  atorvastatin  (LIPITOR) 40 MG tablet Take 1 tablet (40 mg total) by mouth daily at 6 PM. 09/05/15  Yes Vann, Jessica U, DO  Cholecalciferol (VITAMIN D PO) Take 1 tablet by mouth daily.   Yes [provider]  clonazePAM  (KLONOPIN ) 0.5 MG tablet Take 0.5 mg by mouth at bedtime.   Yes [provider]  Cyanocobalamin (VITAMIN B 12 PO) Take 1 tablet by mouth daily.   Yes [provider]  Echinacea 125 MG TABS Take 1 capsule by mouth daily.   Yes [provider]  glucosamine-chondroitin 500-400 MG tablet  Take 1 tablet by mouth 3 (three) times daily.   Yes [provider]  levothyroxine  (SYNTHROID ) 88 MCG tablet Take 88 mcg by mouth daily.   Yes [provider]  metoprolol  tartrate (LOPRESSOR ) 25 MG tablet Take 25 mg by mouth 2 (two) times daily.   Yes [provider]  omega-3 acid ethyl esters (LOVAZA ) 1 G capsule Take by mouth 2 (two) times daily.   Yes [provider]  Rivaroxaban  (XARELTO ) 15 MG TABS tablet Take 1 tablet (15 mg total) by mouth daily with supper. 09/05/15  Yes Vann,  Jessica U, DO  VITAMIN E PO Take 1 tablet by mouth daily.   Yes [provider]  ipratropium (ATROVENT ) 0.03 % nasal spray Place 2 sprays into both nostrils every 12 (twelve) hours.    [provider]  oxyCODONE  (ROXICODONE ) 5 MG immediate release tablet Take 0.5 tablets (2.5 mg total) by mouth every 4 (four) hours as needed for severe pain. 06/06/23   Ninetta Basket, MD  VITAMIN A PO Take 1 tablet by mouth daily. Patient not taking: Reported on 06/03/2024    [provider]    Physical Exam: Vitals:   06/03/24 0202 06/03/24 0210  BP: 114/81   Resp:  (!) 25  Temp:  97.9 F (36.6 C)  SpO2:  98%    Physical Exam   Constitutional: Elderly Caucasian woman in no acute distress Respiratory: Clear, diminished throughout. Normal respiratory effort. No accessory muscle use.  Cardiovascular: Normal rate and irregular rhythm, no murmurs / rubs / gallops. No extremity edema. 2+ radial and pedal pulses.  Abdomen: no tenderness, no masses palpated. No hepatosplenomegaly. Bowel sounds positive.  Musculoskeletal: (R) chest wall tenderness. No joint deformity upper and lower extremities. Good ROM, no contractures. Normal muscle tone.  Skin: Chin contusion and scattered bruising surrounding area, no rashes, ecchymosis noted to left knee.  Neurologic: CN 2-12 grossly intact. Sensation intact, Alert and oriented x 3.   Labs on Admission: I have personally reviewed following labs and imaging studies  CBC: Recent Labs  Lab 06/03/24 0218 06/03/24 0219  WBC 9.8  --   HGB 13.0 12.9  HCT 38.8 38.0  MCV 97.5  --   PLT 518*  --    Basic Metabolic Panel: Recent Labs  Lab 06/03/24 0218 06/03/24 0219  NA 136 136  K 3.8 3.7  CL 104 102  CO2 23  --   GLUCOSE 108* 106*  BUN 11 11  CREATININE 0.70 0.70  CALCIUM  9.2  --    GFR: CrCl cannot be calculated (Unknown ideal weight.). Liver Function Tests: Recent Labs  Lab 06/03/24 0218  AST 20  ALT 13  ALKPHOS 51   BILITOT 0.7  PROT 6.1*  ALBUMIN 3.4*   No results for input(s): LIPASE, AMYLASE in the last 168 hours. No results for input(s): AMMONIA in the last 168 hours. Coagulation Profile: Recent Labs  Lab 06/03/24 0218  INR 1.8*   Cardiac Enzymes: No results for input(s): CKTOTAL, CKMB, CKMBINDEX, TROPONINI, TROPONINIHS in the last 168 hours. BNP (last 3 results) No results for input(s): BNP in the last 8760 hours. HbA1C: No results for input(s): HGBA1C in the last 72 hours. CBG: No results for input(s): GLUCAP in the last 168 hours. Lipid Profile: No results for input(s): CHOL, HDL, LDLCALC, TRIG, CHOLHDL, LDLDIRECT in the last 72 hours. Thyroid Function Tests: No results for input(s): TSH, T4TOTAL, FREET4, T3FREE, THYROIDAB in the last 72 hours. Anemia Panel: No results for  input(s): VITAMINB12, FOLATE, FERRITIN, TIBC, IRON, RETICCTPCT in the last 72 hours. Urine analysis:    Component Value Date/Time   COLORURINE YELLOW 06/03/2024 0316   APPEARANCEUR HAZY (A) 06/03/2024 0316   LABSPEC 1.024 06/03/2024 0316   PHURINE 7.0 06/03/2024 0316   GLUCOSEU NEGATIVE 06/03/2024 0316   HGBUR LARGE (A) 06/03/2024 0316   BILIRUBINUR NEGATIVE 06/03/2024 0316   KETONESUR NEGATIVE 06/03/2024 0316   PROTEINUR 30 (A) 06/03/2024 0316   UROBILINOGEN 0.2 09/03/2015 1500   NITRITE NEGATIVE 06/03/2024 0316   LEUKOCYTESUR LARGE (A) 06/03/2024 0316    Radiological Exams on Admission: I have personally reviewed images CT HEAD WO CONTRAST Result Date: 06/03/2024 CLINICAL DATA:  Head trauma, moderate-severe; Facial trauma, blunt; Polytrauma, blunt EXAM: CT HEAD WITHOUT CONTRAST CT MAXILLOFACIAL WITHOUT CONTRAST CT CERVICAL SPINE WITHOUT CONTRAST TECHNIQUE: Multidetector CT imaging of the head, cervical spine, and maxillofacial structures were performed using the standard protocol without intravenous contrast. Multiplanar CT image reconstructions of  the cervical spine and maxillofacial structures were also generated. RADIATION DOSE REDUCTION: This exam was performed according to the departmental dose-optimization program which includes automated exposure control, adjustment of the mA and/or kV according to patient size and/or use of iterative reconstruction technique. COMPARISON:  None Available. FINDINGS: CT HEAD FINDINGS Brain: No evidence of acute infarction, hemorrhage, hydrocephalus, extra-axial collection or mass lesion/mass effect. Vascular: No hyperdense vessel or unexpected calcification. Skull: No acute fracture. Other: No mastoid effusions. CT MAXILLOFACIAL FINDINGS Osseous: No fracture or mandibular dislocation. No destructive process. Orbits: Negative. No traumatic or inflammatory finding. Sinuses: Right maxillary sinus mucosal thickening air-fluid level. Soft tissues: Contusion/laceration along the chin. CT CERVICAL SPINE FINDINGS Alignment: Degenerative 4 mm of anterolisthesis of C4 on C5 with severe facet arthropathy at this level. Otherwise, no substantial sagittal subluxation. Skull base and vertebrae: No evidence of acute fracture. Soft tissues and spinal canal: No prevertebral fluid or swelling. No visible canal hematoma. Disc levels: Severe multilevel degenerative change with the degenerative disease greatest at C6-C7 and facet arthropathy greatest at C4-C5. Resulting varying degrees of neural foraminal stenosis. Upper chest: Biapical pleuroparenchymal scarring. IMPRESSION: 1. No evidence of acute intracranial abnormality. 2. No evidence of acute facial fracture.  Chin contusion. 3. No evidence of acute fracture or traumatic malalignment in the cervical spine. 4. Multilevel degenerative change with 4 mm of anterolisthesis of C4 on C5. Electronically Signed   By: Stevenson Elbe M.D.   On: 06/03/2024 03:00   CT MAXILLOFACIAL WO CONTRAST Result Date: 06/03/2024 CLINICAL DATA:  Head trauma, moderate-severe; Facial trauma, blunt;  Polytrauma, blunt EXAM: CT HEAD WITHOUT CONTRAST CT MAXILLOFACIAL WITHOUT CONTRAST CT CERVICAL SPINE WITHOUT CONTRAST TECHNIQUE: Multidetector CT imaging of the head, cervical spine, and maxillofacial structures were performed using the standard protocol without intravenous contrast. Multiplanar CT image reconstructions of the cervical spine and maxillofacial structures were also generated. RADIATION DOSE REDUCTION: This exam was performed according to the departmental dose-optimization program which includes automated exposure control, adjustment of the mA and/or kV according to patient size and/or use of iterative reconstruction technique. COMPARISON:  None Available. FINDINGS: CT HEAD FINDINGS Brain: No evidence of acute infarction, hemorrhage, hydrocephalus, extra-axial collection or mass lesion/mass effect. Vascular: No hyperdense vessel or unexpected calcification. Skull: No acute fracture. Other: No mastoid effusions. CT MAXILLOFACIAL FINDINGS Osseous: No fracture or mandibular dislocation. No destructive process. Orbits: Negative. No traumatic or inflammatory finding. Sinuses: Right maxillary sinus mucosal thickening air-fluid level. Soft tissues: Contusion/laceration along the chin. CT CERVICAL SPINE FINDINGS Alignment: Degenerative 4 mm  of anterolisthesis of C4 on C5 with severe facet arthropathy at this level. Otherwise, no substantial sagittal subluxation. Skull base and vertebrae: No evidence of acute fracture. Soft tissues and spinal canal: No prevertebral fluid or swelling. No visible canal hematoma. Disc levels: Severe multilevel degenerative change with the degenerative disease greatest at C6-C7 and facet arthropathy greatest at C4-C5. Resulting varying degrees of neural foraminal stenosis. Upper chest: Biapical pleuroparenchymal scarring. IMPRESSION: 1. No evidence of acute intracranial abnormality. 2. No evidence of acute facial fracture.  Chin contusion. 3. No evidence of acute fracture or  traumatic malalignment in the cervical spine. 4. Multilevel degenerative change with 4 mm of anterolisthesis of C4 on C5. Electronically Signed   By: Stevenson Elbe M.D.   On: 06/03/2024 03:00   CT CERVICAL SPINE WO CONTRAST Result Date: 06/03/2024 CLINICAL DATA:  Head trauma, moderate-severe; Facial trauma, blunt; Polytrauma, blunt EXAM: CT HEAD WITHOUT CONTRAST CT MAXILLOFACIAL WITHOUT CONTRAST CT CERVICAL SPINE WITHOUT CONTRAST TECHNIQUE: Multidetector CT imaging of the head, cervical spine, and maxillofacial structures were performed using the standard protocol without intravenous contrast. Multiplanar CT image reconstructions of the cervical spine and maxillofacial structures were also generated. RADIATION DOSE REDUCTION: This exam was performed according to the departmental dose-optimization program which includes automated exposure control, adjustment of the mA and/or kV according to patient size and/or use of iterative reconstruction technique. COMPARISON:  None Available. FINDINGS: CT HEAD FINDINGS Brain: No evidence of acute infarction, hemorrhage, hydrocephalus, extra-axial collection or mass lesion/mass effect. Vascular: No hyperdense vessel or unexpected calcification. Skull: No acute fracture. Other: No mastoid effusions. CT MAXILLOFACIAL FINDINGS Osseous: No fracture or mandibular dislocation. No destructive process. Orbits: Negative. No traumatic or inflammatory finding. Sinuses: Right maxillary sinus mucosal thickening air-fluid level. Soft tissues: Contusion/laceration along the chin. CT CERVICAL SPINE FINDINGS Alignment: Degenerative 4 mm of anterolisthesis of C4 on C5 with severe facet arthropathy at this level. Otherwise, no substantial sagittal subluxation. Skull base and vertebrae: No evidence of acute fracture. Soft tissues and spinal canal: No prevertebral fluid or swelling. No visible canal hematoma. Disc levels: Severe multilevel degenerative change with the degenerative disease  greatest at C6-C7 and facet arthropathy greatest at C4-C5. Resulting varying degrees of neural foraminal stenosis. Upper chest: Biapical pleuroparenchymal scarring. IMPRESSION: 1. No evidence of acute intracranial abnormality. 2. No evidence of acute facial fracture.  Chin contusion. 3. No evidence of acute fracture or traumatic malalignment in the cervical spine. 4. Multilevel degenerative change with 4 mm of anterolisthesis of C4 on C5. Electronically Signed   By: Stevenson Elbe M.D.   On: 06/03/2024 03:00   CT CHEST ABDOMEN PELVIS W CONTRAST Result Date: 06/03/2024 CLINICAL DATA:  Polytrauma, blunt.  Unwitnessed fall EXAM: CT CHEST, ABDOMEN, AND PELVIS WITH CONTRAST TECHNIQUE: Multidetector CT imaging of the chest, abdomen and pelvis was performed following the standard protocol during bolus administration of intravenous contrast. RADIATION DOSE REDUCTION: This exam was performed according to the departmental dose-optimization program which includes automated exposure control, adjustment of the mA and/or kV according to patient size and/or use of iterative reconstruction technique. CONTRAST:  75mL OMNIPAQUE IOHEXOL 350 MG/ML SOLN COMPARISON:  None Available. FINDINGS: CT CHEST FINDINGS Cardiovascular: No significant vascular findings. Normal heart size. No pericardial effusion. Mild aortic atherosclerotic calcification Mediastinum/Nodes: No enlarged mediastinal, hilar, or axillary lymph nodes. Thyroid gland, trachea, and esophagus demonstrate no significant findings. Lungs/Pleura: Biapical parenchymal scarring. 3 mm pulmonary nodule, left upper lobe, (94/). Lungs are otherwise clear. No pneumothorax or pleural effusion. Musculoskeletal: No acute  bone abnormality. No lytic or blastic bone lesion. Osseous structures are age appropriate. CT ABDOMEN PELVIS FINDINGS Hepatobiliary: Moderate hepatic steatosis. No enhancing intrahepatic mass. No intra or extrahepatic biliary ductal dilation. Gallbladder  unremarkable. Pancreas: Unremarkable Spleen: Unremarkable Adrenals/Urinary Tract: Adrenal glands are unremarkable. Kidneys are normal, without renal calculi, focal lesion, or hydronephrosis. Bladder is unremarkable. Stomach/Bowel: Severe sigmoid diverticulosis. Stomach, small bowel, and large bowel are otherwise unremarkable. Appendix absent. No evidence of obstruction or focal inflammation. No free intraperitoneal gas or fluid. Vascular/Lymphatic: Aortic atherosclerosis. No enlarged abdominal or pelvic lymph nodes. Reproductive: Status post hysterectomy. No adnexal masses. Other: No abdominal wall hernia or abnormality. No abdominopelvic ascites. Musculoskeletal: No acute bone abnormality. No lytic or blastic bone lesion. Osseous structures are age appropriate. Moderate lumbar dextroscoliosis noted. Advanced degenerative changes are noted within the hips bilaterally. IMPRESSION: 1. No acute intrathoracic or intra-abdominal pathology identified. 2. 3 mm left upper lobe pulmonary nodule. 3. Moderate hepatic steatosis. 4. Severe sigmoid diverticulosis. 5. Aortic atherosclerosis. Aortic Atherosclerosis (ICD10-I70.0). Electronically Signed   By: Worthy Heads M.D.   On: 06/03/2024 02:59   DG Knee Left Port Result Date: 06/03/2024 EXAM: 2 VIEW(S) XRAY OF THE LEFT KNEE 06/03/2024 02:31:00 AM COMPARISON: None available. CLINICAL HISTORY: Blunt Trauma. Reason for exam: level 2 fall on blood thinners, pt loss balance getting up from toilet FINDINGS: BONES AND JOINTS: No acute fracture. No focal osseous lesion. No joint dislocation. No significant joint effusion. Moderate tricompartmental degenerative changes, most prominent in the lateral compartment, with chondrocalcinosis. SOFT TISSUES: The soft tissues are unremarkable. IMPRESSION: 1. No acute fracture or dislocation. 2. Moderate tricompartmental degenerative changes. Electronically signed by: Zadie Herter MD 06/03/2024 02:38 AM EDT RP Workstation: [id number]    DG Pelvis Portable Result Date: 06/03/2024 EXAM: 2 VIEW(S) XRAY OF THE PELVIS 06/03/2024 02:31:00 AM COMPARISON: None available. CLINICAL HISTORY: Trauma. Reason for exam: level 2 fall on blood thinners, pt loss balance getting up from toilet. FINDINGS: BONES AND JOINTS: Mild degenerative changes of the lumbar spine with dextroscoliosis. 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 No acute fracture. No joint dislocation. SOFT TISSUES: The soft tissues are unremarkable. IMPRESSION: 1. No acute traumatic injury. 2. 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 Electronically signed by: Zadie Herter MD 06/03/2024 02:38 AM EDT RP Workstation: [id number]   DG Chest Port 1 View Result Date: 06/03/2024 EXAM: 1 VIEW XRAY OF THE CHEST 06/03/2024 02:31:00 AM COMPARISON: None available. CLINICAL HISTORY: Trauma. Reason for exam: level 2 fall on blood thinners, pt loss balance getting up from toilet. FINDINGS: LUNGS AND PLEURA: Increased interstitial markings in the upper lobes, favoring scarring over aspiration. Correlate with pending CT. No pleural effusion. No pneumothorax. HEART AND MEDIASTINUM: Thoracic aortic atherosclerosis. No acute abnormality of the cardiac and mediastinal silhouettes. BONES AND SOFT TISSUES: No acute osseous abnormality. IMPRESSION: 1. Increased interstitial markings in the upper lobes, favoring scarring over aspiration. Correlate with pending CT. Electronically signed by: Zadie Herter MD 06/03/2024 02:36 AM EDT RP Workstation: [id number]    EKG: My personal interpretation of EKG shows: Normal sinus rhythm HR 79   Assessment/Plan ##Recurrent falls ##Chin Laceration s/p repair in ED ##(R) Chest wall tenderness Chin laceration repaired in ED, contusion and bruising noted to the area.  Patient also endorsing (R) chest wall pain.  This pain is reproducible on exam. - Bacitracin to lacerations -  Fall precautions - Hold Xarelto  today - Tdap administered in ED - As  needed analgesia - PT/OT eval and treat - Hold home Klonopin , per PDMP recently started on nightly Klonopin  on 05/25/24  ##Rectal bleeding Patient reports she was straining to have a bowel movement and had to manually disimpact herself, when she looked in the toilet she noted a few drops of bright red blood. - Hold home Xarelto  today - Trend H&H, transfuse for HGB goal > 7 or hemodynamic instability  ##Rule out Near Syncope Events: Patient reports she was straining to have a bowel movement and had to manually disimpact herself, upon standing she fell.  She denies dizziness, lightheadedness, change in sensorium, chest pain, dyspnea, palpitations. She states that she felt normal prior to and after the fall besides pain from the impact itself.  Of note she states there were a few drops of bright red blood in the toilet when she looked after having the bowel movement. She denies a history of hemorrhoids. Neuro findings: No neurodeficits on exam - Orthostatic vital signs  - ECHO - Cardiac Monitoring - Check Troponin 15-->15 - CT Head-negative for acute pathology - Neuro checks - IVF: NS 75 mL/hr - PT/OT eval and treat  #Atrial fibrillation - Hold home Xarelto  today - Hold home metoprolol  pending orthostatic vital signs  #Hypertension  - Hold home metoprolol  pending orthostatic vital signs  #Hypothyroidism - Continue home Synthroid   VTE prophylaxis:  SCDs  GI prophylaxis: Not indicated  Diet: Heart healthy Access: PIV Lines: None Code Status: DNR/DNI, confirmed with patient and MOST form uploaded to media tab Telemetry: Yes Disposition: Observe on MedSurg with telemetry  Admission status: Observation, Telemetry bed Patient is from: Home  Anticipated d/c is to: Home Anticipated d/c date is: 1-2 days Patient currently: Pending near syncope workup and surveillance of hemoglobin trend  Family Communication: Son  Leighton Punches called at [phone], no answer.  HIPAA compliant voicemail left  Consults called: None  Severity of Illness: The appropriate patient status for this patient is OBSERVATION. Observation status is judged to be reasonable and necessary in order to provide the required intensity of service to ensure the patient's safety. The patient's presenting symptoms, physical exam findings, and initial radiographic and laboratory data in the context of their medical condition is felt to place them at decreased risk for further clinical deterioration. Furthermore, it is anticipated that the patient will be medically stable for discharge from the hospital within 2 midnights of admission.   To reach the provider On-Call:   7AM- 7PM see care teams to locate the attending and reach out to them via [email or web address] Password: [email or web address] 7PM-7AM contact night-coverage If you still have difficulty reaching the appropriate provider, please page the [facility] (Director on Call) for [facility] on amion for assistance  This document was prepared using Conservation officer, historic buildings and may include unintentional dictation errors.  Naida Austria FNP-BC, PMHNP-BC Nurse Practitioner [facility]

## 2024-06-03 NOTE — Evaluation (Signed)
 Physical Therapy Evaluation Patient Details Name: Gabriella Shaw MRN: [id number] DOB: 12-Jul-1927 Today's Date: 06/03/2024  History of Present Illness  Pt is a 89 y.o. F presenting to [facility] on 06/03/24 after a fall getting up from the bathroom. PMH is significant for A-fib on Xarelto , HTN, hypothyroidism, TIA, and anxiety.  Clinical Impression  Prior to admittance pt was mobilizing mod I with rolling walker, and lives at an ALF. Pt presents to evaluation with deficits in mobility, balance, strength, power, and activity tolerance, all limiting pt's ability to mobilize near baseline. Pt was able to ambulate room level distances w/ an AD and no physical assistance given. Pt would benefit from further gait and balance training. PT will continue to treat pt while she is admitted. Recommending HHPT at discharge to address remaining mobility deficits and optimize return to PLOF.       If plan is discharge home, recommend the following: A little help with walking and/or transfers;A little help with bathing/dressing/bathroom;Assist for transportation   Can travel by private vehicle        Equipment Recommendations None recommended by PT  Recommendations for Other Services       Functional Status Assessment Patient has had a recent decline in their functional status and demonstrates the ability to make significant improvements in function in a reasonable and predictable amount of time.     Precautions / Restrictions Precautions Precautions: Fall Recall of Precautions/Restrictions: Intact Restrictions Weight Bearing Restrictions Per Provider Order: No      Mobility  Bed Mobility Overal bed mobility: Needs Assistance Bed Mobility: Supine to Sit, Sit to Supine     Supine to sit: Supervision, HOB elevated, Used rails Sit to supine: Supervision, HOB elevated, Used rails   General bed mobility comments: increased time to complete    Transfers Overall transfer level: Needs  assistance Equipment used: Rolling walker (2 wheels) Transfers: Sit to/from Stand Sit to Stand: Contact guard assist           General transfer comment: STS from EOB w/ RW and CGA. VC given for sequencing and upper extremtiy placement; increased time to complete.    Ambulation/Gait Ambulation/Gait assistance: Contact guard assist Gait Distance (Feet): 16 Feet Assistive device: Rolling walker (2 wheels) Gait Pattern/deviations: Step-to pattern, Decreased stride length Gait velocity: reduced Gait velocity interpretation: <1.8 ft/sec, indicate of risk for recurrent falls   General Gait Details: Pt ambulates with reciprocal gait pattern and reduced cadence, w/ reliance on RW for maintaining upright.  Stairs            Wheelchair Mobility     Tilt Bed    Modified Rankin (Stroke Patients Only)       Balance Overall balance assessment: History of Falls, Needs assistance Sitting-balance support: No upper extremity supported, Feet supported Sitting balance-Leahy Scale: Good Sitting balance - Comments: sitting EOB   Standing balance support: Bilateral upper extremity supported, During functional activity, Reliant on assistive device for balance Standing balance-Leahy Scale: Poor Standing balance comment: reliant on external support                             Pertinent Vitals/Pain Pain Assessment Pain Assessment: Faces Faces Pain Scale: Hurts little more Pain Location: R lateral abdomen Pain Descriptors / Indicators: Discomfort, Grimacing Pain Intervention(s): Limited activity within patient's tolerance, Monitored during session    Home Living Family/patient expects to be discharged to:: Assisted living  Home Equipment: Agricultural consultant (2 wheels) Additional Comments: one of pt's sons lives in [employer] [place]    Prior Function Prior Level of Function : Independent/Modified Independent;History of Falls (last six months)              Mobility Comments: mod I utilizing RW ADLs Comments: pt reports independent with all ADLs     Extremity/Trunk Assessment   Upper Extremity Assessment Upper Extremity Assessment: Generalized weakness    Lower Extremity Assessment Lower Extremity Assessment: Generalized weakness    Cervical / Trunk Assessment Cervical / Trunk Assessment: Kyphotic  Communication   Communication Communication: No apparent difficulties    Cognition Arousal: Alert Behavior During Therapy: WFL for tasks assessed/performed   PT - Cognitive impairments: No apparent impairments                         Following commands: Intact       Cueing Cueing Techniques: Verbal cues     General Comments General comments (skin integrity, edema, etc.): orthostatic BP sup: 137/71, seated 148/76, standing: 133/65, 3 mins standing: 130/69; no reports of dizziness throughout session    Exercises     Assessment/Plan    PT Assessment Patient needs continued PT services  PT Problem List Decreased strength;Decreased activity tolerance;Decreased balance;Decreased mobility;Decreased coordination       PT Treatment Interventions DME instruction;Gait training;Functional mobility training;Therapeutic activities;Therapeutic exercise;Balance training;Patient/family education;Manual techniques;Modalities    PT Goals (Current goals can be found in the Care Plan section)  Acute Rehab PT Goals Patient Stated Goal: to get back to golfing PT Goal Formulation: With patient Time For Goal Achievement: 06/17/24 Potential to Achieve Goals: Good    Frequency Min 2X/week     Co-evaluation               AM-PAC PT 6 Clicks Mobility  Outcome Measure Help needed turning from your back to your side while in a flat bed without using bedrails?: A Little Help needed moving from lying on your back to sitting on the side of a flat bed without using bedrails?: A Little Help needed moving to and from a bed to a  chair (including a wheelchair)?: A Little Help needed standing up from a chair using your arms (e.g., wheelchair or bedside chair)?: A Little Help needed to walk in hospital room?: A Little Help needed climbing 3-5 steps with a railing? : A Lot 6 Click Score: 17    End of Session Equipment Utilized During Treatment: Gait belt Activity Tolerance: Patient tolerated treatment well Patient left: in bed;with call bell/phone within reach;with bed alarm set Nurse Communication: Mobility status;Other (comment) (pt requesting automatic trays) PT Visit Diagnosis: Unsteadiness on feet (R26.81);Muscle weakness (generalized) (M62.81);History of falling (Z91.81)    Time: 9811-9147 PT Time Calculation (min) (ACUTE ONLY): 28 min   Charges:   PT Evaluation $PT Eval Low Complexity: 1 Low   PT General Charges $$ ACUTE PT VISIT: 1 Visit         Lonell Rives, SPT Acute Rehab [phone]   Lonell Rives 06/03/2024, 3:43 PM

## 2024-06-04 ENCOUNTER — Observation Stay (HOSPITAL_BASED_OUTPATIENT_CLINIC_OR_DEPARTMENT_OTHER)

## 2024-06-04 DIAGNOSIS — R55 Syncope and collapse: Secondary | ICD-10-CM | POA: Diagnosis not present

## 2024-06-04 DIAGNOSIS — S0181XA Laceration without foreign body of other part of head, initial encounter: Secondary | ICD-10-CM | POA: Diagnosis not present

## 2024-06-04 DIAGNOSIS — I4891 Unspecified atrial fibrillation: Secondary | ICD-10-CM | POA: Diagnosis not present

## 2024-06-04 DIAGNOSIS — R296 Repeated falls: Secondary | ICD-10-CM | POA: Diagnosis not present

## 2024-06-04 LAB — CBC
HCT: 34.3 % — ABNORMAL LOW (ref 36.0–46.0)
Hemoglobin: 11.6 g/dL — ABNORMAL LOW (ref 12.0–15.0)
MCH: 32.2 pg (ref 26.0–34.0)
MCHC: 33.8 g/dL (ref 30.0–36.0)
MCV: 95.3 fL (ref 80.0–100.0)
Platelets: 471 10*3/uL — ABNORMAL HIGH (ref 150–400)
RBC: 3.6 MIL/uL — ABNORMAL LOW (ref 3.87–5.11)
RDW: 14.5 % (ref 11.5–15.5)
WBC: 8.9 10*3/uL (ref 4.0–10.5)
nRBC: 0 % (ref 0.0–0.2)

## 2024-06-04 LAB — ECHOCARDIOGRAM COMPLETE
AR max vel: 2.81 cm2
AV Area VTI: 2.75 cm2
AV Area mean vel: 2.71 cm2
AV Mean grad: 4 mmHg
AV Peak grad: 9.4 mmHg
Ao pk vel: 1.53 m/s
Area-P 1/2: 2.76 cm2
Calc EF: 60.8 %
Height: 67 in
S' Lateral: 3 cm
Single Plane A2C EF: 60.8 %
Single Plane A4C EF: 60.1 %
Weight: 1908.3 [oz_av]

## 2024-06-04 LAB — BASIC METABOLIC PANEL WITH GFR
Anion gap: 10 (ref 5–15)
BUN: 7 mg/dL — ABNORMAL LOW (ref 8–23)
CO2: 22 mmol/L (ref 22–32)
Calcium: 8.8 mg/dL — ABNORMAL LOW (ref 8.9–10.3)
Chloride: 105 mmol/L (ref 98–111)
Creatinine, Ser: 0.65 mg/dL (ref 0.44–1.00)
GFR, Estimated: >60 mL/min (ref >60)
Glucose, Bld: 107 mg/dL — ABNORMAL HIGH (ref 70–99)
Potassium: 3.5 mmol/L (ref 3.5–5.1)
Sodium: 137 mmol/L (ref 135–145)

## 2024-06-04 NOTE — Discharge Instructions (Signed)
 Chin suture will need to be removed by 06/08/24

## 2024-06-04 NOTE — Care Management Obs Status (Signed)
 MEDICARE OBSERVATION STATUS NOTIFICATION   Patient Details  Name: Deshawnda Acrey MRN: [id number] Date of Birth: [birth year]   Medicare Observation Status Notification Given:  Yes    Jon Cruel 06/04/2024, 1:25 PM

## 2024-06-04 NOTE — Evaluation (Signed)
 Occupational Therapy Evaluation Patient Details Name: Gabriella Shaw MRN: [id number] DOB: 28-Dec-1926 Today's Date: 06/04/2024   History of Present Illness   Pt is a 89 y.o. F presenting to [facility] on 06/03/24 after a fall getting up from the bathroom. PMH is significant for A-fib on Xarelto , HTN, hypothyroidism, TIA, and anxiety.     Clinical Impressions Pt admitted for above, PTA pt was at ILF and reports this incidence as her only fall, typically ind for ADLs and ambulating mod I with RW. Pt currently presenting with impaired strength and balance, ambulating room with CGA no AD for room level distance and CGA to setup A for ADLs. Worked with pt to provided needed level of assist with full body dressing to which she completed without physical assist from OT. OT to continue following pt acutely to address listed deficits and progress pt as able. Patient would benefit from post acute Home OT services to help maximize functional independence in natural environment      If plan is discharge home, recommend the following:   Assistance with cooking/housework     Functional Status Assessment   Patient has had a recent decline in their functional status and demonstrates the ability to make significant improvements in function in a reasonable and predictable amount of time.     Equipment Recommendations   None recommended by OT     Recommendations for Other Services         Precautions/Restrictions   Precautions Precautions: Fall Recall of Precautions/Restrictions: Intact Restrictions Weight Bearing Restrictions Per Provider Order: No     Mobility Bed Mobility Overal bed mobility: Needs Assistance Bed Mobility: Supine to Sit, Sit to Supine     Supine to sit: Supervision, HOB elevated, Used rails          Transfers Overall transfer level: Needs assistance Equipment used: Rolling walker (2 wheels) Transfers: Sit to/from Stand Sit to Stand: Contact guard assist            General transfer comment: STS no AD CGA      Balance Overall balance assessment: History of Falls, Needs assistance Sitting-balance support: No upper extremity supported, Feet supported Sitting balance-Leahy Scale: Good Sitting balance - Comments: sitting EOB   Standing balance support: Bilateral upper extremity supported, During functional activity, Reliant on assistive device for balance Standing balance-Leahy Scale: Fair Standing balance comment: short distance ambulation CGA no AD                           ADL either performed or assessed with clinical judgement   ADL Overall ADL's : Needs assistance/impaired Eating/Feeding: Independent;Sitting   Grooming: Standing;Contact guard assist;Brushing hair   Upper Body Bathing: Standing;Contact guard assist   Lower Body Bathing: Sitting/lateral leans;Set up   Upper Body Dressing : Sitting;Set up   Lower Body Dressing: Sit to/from stand;Contact guard assist Lower Body Dressing Details (indicate cue type and reason): no physical assist Toilet Transfer: Contact guard assist;Ambulation   Toileting- Clothing Manipulation and Hygiene: Sit to/from stand;Contact guard assist       Functional mobility during ADLs: Contact guard assist       Vision   Vision Assessment?: No apparent visual deficits     Perception         Praxis         Pertinent Vitals/Pain Pain Assessment Pain Assessment: Faces Faces Pain Scale: Hurts a little bit Pain Location: R lateral abdomen Pain Descriptors / Indicators: Discomfort,  Grimacing Pain Intervention(s): Limited activity within patient's tolerance, Monitored during session     Extremity/Trunk Assessment Upper Extremity Assessment Upper Extremity Assessment: Generalized weakness   Lower Extremity Assessment Lower Extremity Assessment: Generalized weakness   Cervical / Trunk Assessment Cervical / Trunk Assessment: Kyphotic   Communication  Communication Communication: No apparent difficulties   Cognition Arousal: Alert Behavior During Therapy: WFL for tasks assessed/performed Cognition: No apparent impairments                               Following commands: Intact       Cueing  General Comments   Cueing Techniques: Verbal cues  VSS; provided pt with mask as per her request   Exercises     Shoulder Instructions      Home Living Family/patient expects to be discharged to:: Assisted living                             Home Equipment: Rolling Walker (2 wheels)   Additional Comments: one of pt's sons lives in [place]      Prior Functioning/Environment Prior Level of Function : Independent/Modified Independent;History of Falls (last six months)             Mobility Comments: mod I utilizing RW ADLs Comments: pt reports independent with all ADLs    OT Problem List: Pain;Impaired balance (sitting and/or standing);Decreased strength   OT Treatment/Interventions: Self-care/ADL training;Balance training;Therapeutic activities;DME and/or AE instruction      OT Goals(Current goals can be found in the care plan section)   Acute Rehab OT Goals Patient Stated Goal: Go home OT Goal Formulation: With patient Time For Goal Achievement: 06/18/24 Potential to Achieve Goals: Good   OT Frequency:  Min 2X/week    Co-evaluation              AM-PAC OT 6 Clicks Daily Activity     Outcome Measure Help from another person eating meals?: None Help from another person taking care of personal grooming?: A Little Help from another person toileting, which includes using toliet, bedpan, or urinal?: A Little Help from another person bathing (including washing, rinsing, drying)?: A Little Help from another person to put on and taking off regular upper body clothing?: A Little Help from another person to put on and taking off regular lower body clothing?: A Little 6 Click Score: 19    End of Session Equipment Utilized During Treatment: Gait belt Nurse Communication: Mobility status  Activity Tolerance: Patient tolerated treatment well Patient left: with call bell/phone within reach;in bed;Other (comment);with bed alarm set (sitting EOB with call bell in reach)  OT Visit Diagnosis: Unsteadiness on feet (R26.81);Pain;Muscle weakness (generalized) (M62.81) Pain - Right/Left: Right Pain - part of body:  (abdomen)                Time: 8551-8491 OT Time Calculation (min): 20 min Charges:  OT General Charges $OT Visit: 1 Visit OT Evaluation $OT Eval Low Complexity: 1 Low  06/04/2024  AB, OTR/L  Acute Rehabilitation Services  Office: [phone]   Curtistine JONETTA Das 06/04/2024, 4:31 PM

## 2024-06-04 NOTE — Discharge Summary (Signed)
 Physician Discharge Summary   Gabriella Shaw FMW:[id number] DOB: 1927/02/10 DOA: 06/03/2024  PCP: Freddrick, No  Admit date: 06/03/2024 Discharge date: 06/04/2024  Admitted From: [facility] Disposition:  [facility] Discharging physician: Alm Apo, MD Barriers to discharge: none  Recommendations at discharge: Needs chin suture removed or inspected by 06/08/24 Consider taper off of clonazepam  to help reduce risk of falls  Home Health: PT  Discharge Condition: stable CODE STATUS: DNR Diet recommendation:  Diet Orders (From admission, onward)     Start     Ordered   06/04/24 0000  Diet - low sodium heart healthy        06/04/24 1443   06/03/24 0845  Diet Heart Room service appropriate? Yes; Fluid consistency: Thin  Diet effective now       Question Answer Comment  Room service appropriate? Yes   Fluid consistency: Thin      06/03/24 0844            Hospital Course: 89 yo female admitted after fall in the bathroom.  She denies loss of consciousness and remembers the fall.  States that she was self disimpacting and had a few drops of red blood afterwards.  Upon standing up she lost balance and fell forward on the floor hitting her right side.  In trying to get up she hit her chin at some point on the floor causing a laceration. She required assistance getting to the bathroom while in the hospital therefore PT/OT have been consulted. Blood pressure and hemoglobin have been stable.   No plans for intervention per GI, agree with this also. Seems the blood was from straining and disimpaction.    Xarelto  held on admission.  Hemoglobin remained stable, 11.6 g/dL at discharge.  ER did place 1 suture on her chin on 06/03/2024.  This will need to be removed or inspected by 06/08/2024.  Orthostatic blood pressure checked prior to discharge and negative.  Echo was obtained and pending at time of discharge.  Of note, given advanced age, would be cautious continuing with  prescribing benzos.  Concerned that this would contribute to her falling especially in the evenings.    The patient's acute and chronic medical conditions were treated accordingly. On day of discharge, patient was felt deemed stable for discharge. Patient/family member advised to call PCP or come back to ER if needed.   Principal Diagnosis: Recurrent falls  Discharge Diagnoses: Active Hospital Problems   Diagnosis Date Noted   Recurrent falls 06/03/2024    Priority: 1.   Chin laceration 06/03/2024    Priority: 2.   Atrial fibrillation (HCC) 09/03/2015   Essential hypertension 09/03/2015   Hypothyroidism 09/03/2015    Resolved Hospital Problems   Diagnosis Date Noted Date Resolved   Rectal bleeding 06/03/2024 06/04/2024    Priority: 2.     Discharge Instructions     Diet - low sodium heart healthy   Complete by: As directed    Discharge wound care:   Complete by: As directed    Suture on chin will need to be removed by 06/08/24   Increase activity slowly   Complete by: As directed       Allergies as of 06/04/2024   No Known Allergies      Medication List     STOP taking these medications    oxyCODONE  5 MG immediate release tablet Commonly known as: Roxicodone    VITAMIN A PO       TAKE these medications  atorvastatin  40 MG tablet Commonly known as: LIPITOR Take 1 tablet (40 mg total) by mouth daily at 6 PM.   clonazePAM  0.5 MG tablet Commonly known as: KLONOPIN  Take 0.5 mg by mouth at bedtime.   Echinacea 125 MG Tabs Take 1 capsule by mouth daily.   glucosamine-chondroitin 500-400 MG tablet Take 1 tablet by mouth 3 (three) times daily.   ipratropium 0.03 % nasal spray Commonly known as: ATROVENT  Place 2 sprays into both nostrils every 12 (twelve) hours.   levothyroxine  88 MCG tablet Commonly known as: SYNTHROID  Take 88 mcg by mouth daily.   metoprolol  tartrate 25 MG tablet Commonly known as: LOPRESSOR  Take 25 mg by mouth 2 (two) times  daily.   omega-3 acid ethyl esters 1 g capsule Commonly known as: LOVAZA  Take by mouth 2 (two) times daily.   Rivaroxaban  15 MG Tabs tablet Commonly known as: XARELTO  Take 1 tablet (15 mg total) by mouth daily with supper.   VITAMIN B 12 PO Take 1 tablet by mouth daily.   VITAMIN C  PO Take 1 tablet by mouth daily.   VITAMIN D  PO Take 1 tablet by mouth daily.   VITAMIN E PO Take 1 tablet by mouth daily.               Discharge Care Instructions  (From admission, onward)           Start     Ordered   06/04/24 0000  Discharge wound care:       Comments: Suture on chin will need to be removed by 06/08/24   06/04/24 1443            No Known Allergies  Consultations: GI  Procedures:   Discharge Exam: BP 137/86 (BP Location: Left Arm)   Pulse 85   Temp 98 F (36.7 C) (Oral)   Resp 16   Ht 5' 7 (1.702 m)   Wt 54.1 kg   SpO2 92%   BMI 18.68 kg/m  Physical Exam Constitutional:      Appearance: Normal appearance.  HENT:     Head: Normocephalic and atraumatic.     Comments: Chin noted with scab and bruising.  Unable to see suture due to scab covering    Mouth/Throat:     Mouth: Mucous membranes are moist.   Eyes:     Extraocular Movements: Extraocular movements intact.    Cardiovascular:     Rate and Rhythm: Normal rate. Rhythm irregular.  Pulmonary:     Effort: Pulmonary effort is normal. No respiratory distress.     Breath sounds: Normal breath sounds. No wheezing.  Abdominal:     General: Bowel sounds are normal. There is no distension.     Palpations: Abdomen is soft.     Tenderness: There is no abdominal tenderness.   Musculoskeletal:        General: Normal range of motion.     Cervical back: Normal range of motion and neck supple.   Skin:    General: Skin is warm and dry.   Neurological:     General: No focal deficit present.     Mental Status: She is alert.   Psychiatric:        Mood and Affect: Mood normal.      The  results of significant diagnostics from this hospitalization (including imaging, microbiology, ancillary and laboratory) are listed below for reference.   Microbiology: No results found for this or any previous visit (from the past 240 hours).  Labs: BNP (last 3 results) No results for input(s): BNP in the last 8760 hours. Basic Metabolic Panel: Recent Labs  Lab 06/03/24 0218 06/03/24 0219 06/04/24 0217  NA 136 136 137  K 3.8 3.7 3.5  CL 104 102 105  CO2 23  --  22  GLUCOSE 108* 106* 107*  BUN 11 11 7*  CREATININE 0.70 0.70 0.65  CALCIUM  9.2  --  8.8*   Liver Function Tests: Recent Labs  Lab 06/03/24 0218  AST 20  ALT 13  ALKPHOS 51  BILITOT 0.7  PROT 6.1*  ALBUMIN 3.4*   No results for input(s): LIPASE, AMYLASE in the last 168 hours. No results for input(s): AMMONIA in the last 168 hours. CBC: Recent Labs  Lab 06/03/24 0218 06/03/24 0219 06/03/24 0848 06/03/24 1624 06/03/24 2059 06/04/24 0217  WBC 9.8  --   --   --   --  8.9  HGB 13.0 12.9 11.7* 12.0 12.9 11.6*  HCT 38.8 38.0 35.6* 35.9* 38.4 34.3*  MCV 97.5  --   --   --   --  95.3  PLT 518*  --   --   --   --  471*   Cardiac Enzymes: No results for input(s): CKTOTAL, CKMB, CKMBINDEX, TROPONINI in the last 168 hours. BNP: Invalid input(s): POCBNP CBG: No results for input(s): GLUCAP in the last 168 hours. D-Dimer No results for input(s): DDIMER in the last 72 hours. Hgb A1c No results for input(s): HGBA1C in the last 72 hours. Lipid Profile No results for input(s): CHOL, HDL, LDLCALC, TRIG, CHOLHDL, LDLDIRECT in the last 72 hours. Thyroid function studies No results for input(s): TSH, T4TOTAL, T3FREE, THYROIDAB in the last 72 hours.  Invalid input(s): FREET3 Anemia work up No results for input(s): VITAMINB12, FOLATE, FERRITIN, TIBC, IRON, RETICCTPCT in the last 72 hours. Urinalysis    Component Value Date/Time   COLORURINE YELLOW  06/03/2024 0316   APPEARANCEUR HAZY (A) 06/03/2024 0316   LABSPEC 1.024 06/03/2024 0316   PHURINE 7.0 06/03/2024 0316   GLUCOSEU NEGATIVE 06/03/2024 0316   HGBUR LARGE (A) 06/03/2024 0316   BILIRUBINUR NEGATIVE 06/03/2024 0316   KETONESUR NEGATIVE 06/03/2024 0316   PROTEINUR 30 (A) 06/03/2024 0316   UROBILINOGEN 0.2 09/03/2015 1500   NITRITE NEGATIVE 06/03/2024 0316   LEUKOCYTESUR LARGE (A) 06/03/2024 0316   Sepsis Labs Recent Labs  Lab 06/03/24 0218 06/04/24 0217  WBC 9.8 8.9   Microbiology No results found for this or any previous visit (from the past 240 hours).  Procedures/Studies: ECHOCARDIOGRAM COMPLETE Result Date: 06/04/2024    ECHOCARDIOGRAM REPORT   Patient Name:   Gabriella Shaw Date of Exam: 06/04/2024 Medical Rec #:  [id number]        Height:       67.0 in Accession #:    [id number]       Weight:       119.3 lb Date of Birth:  03/01/1927        BSA:          1.623 m Patient Age:    89 years         BP:           129/75 mmHg Patient Gender: F                HR:           91 bpm. Exam Location:  Inpatient Procedure: 2D Echo, Color Doppler and Cardiac Doppler (Both Spectral and Color  Flow Doppler were utilized during procedure). Indications:    R55 Syncope  History:        Patient has prior history of Echocardiogram examinations, most                 recent 09/04/2015.  Sonographer:    Eva Lash Referring Phys: [id number] KATY L FOUST IMPRESSIONS  1. Left ventricular ejection fraction, by estimation, is 60 to 65%. The left ventricle has normal function. The left ventricle has no regional wall motion abnormalities. There is mild left ventricular hypertrophy. Left ventricular diastolic parameters are indeterminate.  2. Right ventricular systolic function is normal. The right ventricular size is normal.  3. The mitral valve is normal in structure. Trivial mitral valve regurgitation. No evidence of mitral stenosis.  4. The aortic valve is normal in structure. Aortic valve  regurgitation is moderate. No aortic stenosis is present.  5. The inferior vena cava is normal in size with greater than 50% respiratory variability, suggesting right atrial pressure of 3 mmHg. FINDINGS  Left Ventricle: Left ventricular ejection fraction, by estimation, is 60 to 65%. The left ventricle has normal function. The left ventricle has no regional wall motion abnormalities. The left ventricular internal cavity size was normal in size. There is  mild left ventricular hypertrophy. Left ventricular diastolic parameters are indeterminate. Right Ventricle: The right ventricular size is normal. No increase in right ventricular wall thickness. Right ventricular systolic function is normal. Left Atrium: Left atrial size was normal in size. Right Atrium: Right atrial size was normal in size. Pericardium: There is no evidence of pericardial effusion. Mitral Valve: The mitral valve is normal in structure. Trivial mitral valve regurgitation. No evidence of mitral valve stenosis. Tricuspid Valve: The tricuspid valve is normal in structure. Tricuspid valve regurgitation is not demonstrated. No evidence of tricuspid stenosis. Aortic Valve: The aortic valve is normal in structure. Aortic valve regurgitation is moderate. No aortic stenosis is present. Aortic valve mean gradient measures 4.0 mmHg. Aortic valve peak gradient measures 9.4 mmHg. Aortic valve area, by VTI measures 2.75 cm. Pulmonic Valve: The pulmonic valve was normal in structure. Pulmonic valve regurgitation is not visualized. No evidence of pulmonic stenosis. Aorta: The aortic root is normal in size and structure. Venous: The inferior vena cava is normal in size with greater than 50% respiratory variability, suggesting right atrial pressure of 3 mmHg. IAS/Shunts: No atrial level shunt detected by color flow Doppler.  LEFT VENTRICLE PLAX 2D LVIDd:         4.10 cm      Diastology LVIDs:         3.00 cm      LV e' medial:    8.59 cm/s LV PW:         1.10 cm       LV E/e' medial:  9.2 LV IVS:        1.20 cm      LV e' lateral:   9.57 cm/s LVOT diam:     2.10 cm      LV E/e' lateral: 8.3 LV SV:         67 LV SV Index:   41 LVOT Area:     3.46 cm  LV Volumes (MOD) LV vol d, MOD A2C: 77.1 ml LV vol d, MOD A4C: 113.0 ml LV vol s, MOD A2C: 30.2 ml LV vol s, MOD A4C: 45.1 ml LV SV MOD A2C:     46.9 ml LV SV MOD A4C:  113.0 ml LV SV MOD BP:      58.2 ml RIGHT VENTRICLE RV S prime:     17.60 cm/s TAPSE (M-mode): 2.3 cm LEFT ATRIUM             Index LA Vol (A2C):   36.3 ml 22.36 ml/m LA Vol (A4C):   40.8 ml 25.13 ml/m LA Biplane Vol: 39.1 ml 24.09 ml/m  AORTIC VALVE AV Area (Vmax):    2.81 cm AV Area (Vmean):   2.71 cm AV Area (VTI):     2.75 cm AV Vmax:           153.00 cm/s AV Vmean:          89.000 cm/s AV VTI:            0.243 m AV Peak Grad:      9.4 mmHg AV Mean Grad:      4.0 mmHg LVOT Vmax:         124.00 cm/s LVOT Vmean:        69.700 cm/s LVOT VTI:          0.193 m LVOT/AV VTI ratio: 0.79  AORTA Ao Asc diam: 3.90 cm MITRAL VALVE MV Area (PHT): 2.76 cm    SHUNTS MV Decel Time: 275 msec    Systemic VTI:  0.19 m MV E velocity: 79.10 cm/s  Systemic Diam: 2.10 cm MV A velocity: 76.50 cm/s MV E/A ratio:  1.03 Oneil Parchment MD Electronically signed by Oneil Parchment MD Signature Date/Time: 06/04/2024/2:43:13 PM    Final    CT HEAD WO CONTRAST Result Date: 06/03/2024 CLINICAL DATA:  Head trauma, moderate-severe; Facial trauma, blunt; Polytrauma, blunt EXAM: CT HEAD WITHOUT CONTRAST CT MAXILLOFACIAL WITHOUT CONTRAST CT CERVICAL SPINE WITHOUT CONTRAST TECHNIQUE: Multidetector CT imaging of the head, cervical spine, and maxillofacial structures were performed using the standard protocol without intravenous contrast. Multiplanar CT image reconstructions of the cervical spine and maxillofacial structures were also generated. RADIATION DOSE REDUCTION: This exam was performed according to the departmental dose-optimization program which includes automated exposure control,  adjustment of the mA and/or kV according to patient size and/or use of iterative reconstruction technique. COMPARISON:  None Available. FINDINGS: CT HEAD FINDINGS Brain: No evidence of acute infarction, hemorrhage, hydrocephalus, extra-axial collection or mass lesion/mass effect. Vascular: No hyperdense vessel or unexpected calcification. Skull: No acute fracture. Other: No mastoid effusions. CT MAXILLOFACIAL FINDINGS Osseous: No fracture or mandibular dislocation. No destructive process. Orbits: Negative. No traumatic or inflammatory finding. Sinuses: Right maxillary sinus mucosal thickening air-fluid level. Soft tissues: Contusion/laceration along the chin. CT CERVICAL SPINE FINDINGS Alignment: Degenerative 4 mm of anterolisthesis of C4 on C5 with severe facet arthropathy at this level. Otherwise, no substantial sagittal subluxation. Skull base and vertebrae: No evidence of acute fracture. Soft tissues and spinal canal: No prevertebral fluid or swelling. No visible canal hematoma. Disc levels: Severe multilevel degenerative change with the degenerative disease greatest at C6-C7 and facet arthropathy greatest at C4-C5. Resulting varying degrees of neural foraminal stenosis. Upper chest: Biapical pleuroparenchymal scarring. IMPRESSION: 1. No evidence of acute intracranial abnormality. 2. No evidence of acute facial fracture.  Chin contusion. 3. No evidence of acute fracture or traumatic malalignment in the cervical spine. 4. Multilevel degenerative change with 4 mm of anterolisthesis of C4 on C5. Electronically Signed   By: Gilmore GORMAN Molt M.D.   On: 06/03/2024 03:00   CT MAXILLOFACIAL WO CONTRAST Result Date: 06/03/2024 CLINICAL DATA:  Head trauma, moderate-severe; Facial trauma, blunt; Polytrauma, blunt EXAM: CT  HEAD WITHOUT CONTRAST CT MAXILLOFACIAL WITHOUT CONTRAST CT CERVICAL SPINE WITHOUT CONTRAST TECHNIQUE: Multidetector CT imaging of the head, cervical spine, and maxillofacial structures were performed  using the standard protocol without intravenous contrast. Multiplanar CT image reconstructions of the cervical spine and maxillofacial structures were also generated. RADIATION DOSE REDUCTION: This exam was performed according to the departmental dose-optimization program which includes automated exposure control, adjustment of the mA and/or kV according to patient size and/or use of iterative reconstruction technique. COMPARISON:  None Available. FINDINGS: CT HEAD FINDINGS Brain: No evidence of acute infarction, hemorrhage, hydrocephalus, extra-axial collection or mass lesion/mass effect. Vascular: No hyperdense vessel or unexpected calcification. Skull: No acute fracture. Other: No mastoid effusions. CT MAXILLOFACIAL FINDINGS Osseous: No fracture or mandibular dislocation. No destructive process. Orbits: Negative. No traumatic or inflammatory finding. Sinuses: Right maxillary sinus mucosal thickening air-fluid level. Soft tissues: Contusion/laceration along the chin. CT CERVICAL SPINE FINDINGS Alignment: Degenerative 4 mm of anterolisthesis of C4 on C5 with severe facet arthropathy at this level. Otherwise, no substantial sagittal subluxation. Skull base and vertebrae: No evidence of acute fracture. Soft tissues and spinal canal: No prevertebral fluid or swelling. No visible canal hematoma. Disc levels: Severe multilevel degenerative change with the degenerative disease greatest at C6-C7 and facet arthropathy greatest at C4-C5. Resulting varying degrees of neural foraminal stenosis. Upper chest: Biapical pleuroparenchymal scarring. IMPRESSION: 1. No evidence of acute intracranial abnormality. 2. No evidence of acute facial fracture.  Chin contusion. 3. No evidence of acute fracture or traumatic malalignment in the cervical spine. 4. Multilevel degenerative change with 4 mm of anterolisthesis of C4 on C5. Electronically Signed   By: Gilmore GORMAN Molt M.D.   On: 06/03/2024 03:00   CT CERVICAL SPINE WO  CONTRAST Result Date: 06/03/2024 CLINICAL DATA:  Head trauma, moderate-severe; Facial trauma, blunt; Polytrauma, blunt EXAM: CT HEAD WITHOUT CONTRAST CT MAXILLOFACIAL WITHOUT CONTRAST CT CERVICAL SPINE WITHOUT CONTRAST TECHNIQUE: Multidetector CT imaging of the head, cervical spine, and maxillofacial structures were performed using the standard protocol without intravenous contrast. Multiplanar CT image reconstructions of the cervical spine and maxillofacial structures were also generated. RADIATION DOSE REDUCTION: This exam was performed according to the departmental dose-optimization program which includes automated exposure control, adjustment of the mA and/or kV according to patient size and/or use of iterative reconstruction technique. COMPARISON:  None Available. FINDINGS: CT HEAD FINDINGS Brain: No evidence of acute infarction, hemorrhage, hydrocephalus, extra-axial collection or mass lesion/mass effect. Vascular: No hyperdense vessel or unexpected calcification. Skull: No acute fracture. Other: No mastoid effusions. CT MAXILLOFACIAL FINDINGS Osseous: No fracture or mandibular dislocation. No destructive process. Orbits: Negative. No traumatic or inflammatory finding. Sinuses: Right maxillary sinus mucosal thickening air-fluid level. Soft tissues: Contusion/laceration along the chin. CT CERVICAL SPINE FINDINGS Alignment: Degenerative 4 mm of anterolisthesis of C4 on C5 with severe facet arthropathy at this level. Otherwise, no substantial sagittal subluxation. Skull base and vertebrae: No evidence of acute fracture. Soft tissues and spinal canal: No prevertebral fluid or swelling. No visible canal hematoma. Disc levels: Severe multilevel degenerative change with the degenerative disease greatest at C6-C7 and facet arthropathy greatest at C4-C5. Resulting varying degrees of neural foraminal stenosis. Upper chest: Biapical pleuroparenchymal scarring. IMPRESSION: 1. No evidence of acute intracranial  abnormality. 2. No evidence of acute facial fracture.  Chin contusion. 3. No evidence of acute fracture or traumatic malalignment in the cervical spine. 4. Multilevel degenerative change with 4 mm of anterolisthesis of C4 on C5. Electronically Signed   By: Gilmore GORMAN Molt CHRISTELLA.D.  On: 06/03/2024 03:00   CT CHEST ABDOMEN PELVIS W CONTRAST Result Date: 06/03/2024 CLINICAL DATA:  Polytrauma, blunt.  Unwitnessed fall EXAM: CT CHEST, ABDOMEN, AND PELVIS WITH CONTRAST TECHNIQUE: Multidetector CT imaging of the chest, abdomen and pelvis was performed following the standard protocol during bolus administration of intravenous contrast. RADIATION DOSE REDUCTION: This exam was performed according to the departmental dose-optimization program which includes automated exposure control, adjustment of the mA and/or kV according to patient size and/or use of iterative reconstruction technique. CONTRAST:  75mL OMNIPAQUE  IOHEXOL  350 MG/ML SOLN COMPARISON:  None Available. FINDINGS: CT CHEST FINDINGS Cardiovascular: No significant vascular findings. Normal heart size. No pericardial effusion. Mild aortic atherosclerotic calcification Mediastinum/Nodes: No enlarged mediastinal, hilar, or axillary lymph nodes. Thyroid gland, trachea, and esophagus demonstrate no significant findings. Lungs/Pleura: Biapical parenchymal scarring. 3 mm pulmonary nodule, left upper lobe, (94/). Lungs are otherwise clear. No pneumothorax or pleural effusion. Musculoskeletal: No acute bone abnormality. No lytic or blastic bone lesion. Osseous structures are age appropriate. CT ABDOMEN PELVIS FINDINGS Hepatobiliary: Moderate hepatic steatosis. No enhancing intrahepatic mass. No intra or extrahepatic biliary ductal dilation. Gallbladder unremarkable. Pancreas: Unremarkable Spleen: Unremarkable Adrenals/Urinary Tract: Adrenal glands are unremarkable. Kidneys are normal, without renal calculi, focal lesion, or hydronephrosis. Bladder is unremarkable.  Stomach/Bowel: Severe sigmoid diverticulosis. Stomach, small bowel, and large bowel are otherwise unremarkable. Appendix absent. No evidence of obstruction or focal inflammation. No free intraperitoneal gas or fluid. Vascular/Lymphatic: Aortic atherosclerosis. No enlarged abdominal or pelvic lymph nodes. Reproductive: Status post hysterectomy. No adnexal masses. Other: No abdominal wall hernia or abnormality. No abdominopelvic ascites. Musculoskeletal: No acute bone abnormality. No lytic or blastic bone lesion. Osseous structures are age appropriate. Moderate lumbar dextroscoliosis noted. Advanced degenerative changes are noted within the hips bilaterally. IMPRESSION: 1. No acute intrathoracic or intra-abdominal pathology identified. 2. 3 mm left upper lobe pulmonary nodule. 3. Moderate hepatic steatosis. 4. Severe sigmoid diverticulosis. 5. Aortic atherosclerosis. Aortic Atherosclerosis (ICD10-I70.0). Electronically Signed   By: Dorethia Molt M.D.   On: 06/03/2024 02:59   DG Knee Left Port Result Date: 06/03/2024 EXAM: 2 VIEW(S) XRAY OF THE LEFT KNEE 06/03/2024 02:31:00 AM COMPARISON: None available. CLINICAL HISTORY: Blunt Trauma. Reason for exam: level 2 fall on blood thinners, pt loss balance getting up from toilet FINDINGS: BONES AND JOINTS: No acute fracture. No focal osseous lesion. No joint dislocation. No significant joint effusion. Moderate tricompartmental degenerative changes, most prominent in the lateral compartment, with chondrocalcinosis. SOFT TISSUES: The soft tissues are unremarkable. IMPRESSION: 1. No acute fracture or dislocation. 2. Moderate tricompartmental degenerative changes. Electronically signed by: Pinkie Pebbles MD 06/03/2024 02:38 AM EDT RP Workstation: [id number]   DG Pelvis Portable Result Date: 06/03/2024 EXAM: 2 VIEW(S) XRAY OF THE PELVIS 06/03/2024 02:31:00 AM COMPARISON: None available. CLINICAL HISTORY: Trauma. Reason for exam: level 2 fall on blood thinners, pt loss  balance getting up from toilet. FINDINGS: BONES AND JOINTS: Mild degenerative changes of the lumbar spine with dextroscoliosis. 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 No acute fracture. No joint dislocation. SOFT TISSUES: The soft tissues are unremarkable. IMPRESSION: 1. No acute traumatic injury. 2. 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 Electronically signed by: Pinkie Pebbles MD 06/03/2024 02:38 AM EDT RP Workstation: [id number]   DG Chest Port 1 View Result Date: 06/03/2024 EXAM: 1 VIEW XRAY OF THE CHEST 06/03/2024 02:31:00 AM COMPARISON: None available. CLINICAL HISTORY: Trauma. Reason for exam: level 2 fall on blood thinners, pt  loss balance getting up from toilet. FINDINGS: LUNGS AND PLEURA: Increased interstitial markings in the upper lobes, favoring scarring over aspiration. Correlate with pending CT. No pleural effusion. No pneumothorax. HEART AND MEDIASTINUM: Thoracic aortic atherosclerosis. No acute abnormality of the cardiac and mediastinal silhouettes. BONES AND SOFT TISSUES: No acute osseous abnormality. IMPRESSION: 1. Increased interstitial markings in the upper lobes, favoring scarring over aspiration. Correlate with pending CT. Electronically signed by: Pinkie Pebbles MD 06/03/2024 02:36 AM EDT RP Workstation: [id number]     Time coordinating discharge: Over 30 minutes    Alm Apo, MD  [facility] 06/04/2024, 2:47 PM

## 2024-06-04 NOTE — TOC Transition Note (Signed)
 Transition of Care [facility]) - Discharge Note   Patient Details  Name: Gabriella Shaw MRN: [id number] Date of Birth: 1927-12-06  Transition of Care [facility]) CM/SW Contact:  Marval Gell, RN Phone Number: 06/04/2024, 3:28 PM   Clinical Narrative:     Beatris w patient at bedside.  She states that she lives at [facility] in [place]. Offered to review her DC plan with family and she declined stating that if I called her sons who live in [place] and the [place] they would be upset to be bothered.  She would like to return to [facility] with therapies through their provider. I have called and left a VM with Cruz Daws providing her information so she can receive PT OT.  No DME needs.  She has card to pay for cab, provided nurse with number to call when they are ready.   Final next level of care: Home/Self Care Barriers to Discharge: No Barriers Identified   Patient Goals and CMS Choice Patient states their goals for this hospitalization and ongoing recovery are:: to return to ILF at [facility].gov Compare Post Acute Care list provided to:: Patient        Discharge Placement                       Discharge Plan and Services Additional resources added to the After Visit Summary for     Discharge Planning Services: CM Consult Post Acute Care Choice: Home Health                               Social Drivers of Health (SDOH) Interventions SDOH Screenings   Food Insecurity: No Food Insecurity (06/03/2024)  Housing: Low Risk  (06/03/2024)  Transportation Needs: No Transportation Needs (06/03/2024)  Utilities: Not At Risk (06/03/2024)  Social Connections: Unknown (06/03/2024)  Tobacco Use: Medium Risk (06/03/2024)     Readmission Risk Interventions     No data to display

## 2024-06-04 NOTE — Progress Notes (Signed)
*  PRELIMINARY RESULTS* Echocardiogram 2D Echocardiogram has been performed.  Gabriella Shaw 06/04/2024, 2:07 PM

## 2024-06-04 NOTE — Progress Notes (Signed)
 DISCHARGE NOTE HOME Gabriella Shaw to be discharged Home per MD order. Discussed prescriptions and follow up appointments with the patient. Prescriptions given to patient; medication list explained in detail. Patient verbalized understanding.  Skin clean, dry and intact without evidence of skin break down, no evidence of skin tears noted. IV catheter discontinued intact. Site without signs and symptoms of complications. Dressing and pressure applied. Pt denies pain at the site currently. No complaints noted.  Ecchymosis chin from fall at home .Patient free of other lines, drains, and wounds.   An After Visit Summary (AVS) was printed and given to the patient. Patient escorted via wheelchair, and discharged home via private auto.  [name], RN

## 2024-08-04 ENCOUNTER — Emergency Department (HOSPITAL_COMMUNITY)
Admission: EM | Admit: 2024-08-04 | Discharge: 2024-08-04 | Disposition: A | Discharge status: Home / Self Care | Attending: Emergency Medicine | Admitting: Emergency Medicine

## 2024-08-04 DIAGNOSIS — Z79899 Other long term (current) drug therapy: Secondary | ICD-10-CM | POA: Insufficient documentation

## 2024-08-04 DIAGNOSIS — I10 Essential (primary) hypertension: Secondary | ICD-10-CM | POA: Insufficient documentation

## 2024-08-04 DIAGNOSIS — Z7901 Long term (current) use of anticoagulants: Secondary | ICD-10-CM | POA: Diagnosis not present

## 2024-08-04 DIAGNOSIS — K625 Hemorrhage of anus and rectum: Secondary | ICD-10-CM | POA: Insufficient documentation

## 2024-08-04 DIAGNOSIS — E039 Hypothyroidism, unspecified: Secondary | ICD-10-CM | POA: Insufficient documentation

## 2024-08-04 LAB — CBC WITH DIFFERENTIAL/PLATELET
Abs Immature Granulocytes: 0.01 K/uL (ref 0.00–0.07)
Basophils Absolute: 0.1 K/uL (ref 0.0–0.1)
Basophils Relative: 1 %
Eosinophils Absolute: 0.2 K/uL (ref 0.0–0.5)
Eosinophils Relative: 3 %
HCT: 35.9 % — ABNORMAL LOW (ref 36.0–46.0)
Hemoglobin: 11.5 g/dL — ABNORMAL LOW (ref 12.0–15.0)
Immature Granulocytes: 0 %
Lymphocytes Relative: 30 %
Lymphs Abs: 2.2 K/uL (ref 0.7–4.0)
MCH: 31.9 pg (ref 26.0–34.0)
MCHC: 32 g/dL (ref 30.0–36.0)
MCV: 99.7 fL (ref 80.0–100.0)
Monocytes Absolute: 1.4 K/uL — ABNORMAL HIGH (ref 0.1–1.0)
Monocytes Relative: 19 %
Neutro Abs: 3.5 K/uL (ref 1.7–7.7)
Neutrophils Relative %: 47 %
Platelets: 487 K/uL — ABNORMAL HIGH (ref 150–400)
RBC: 3.6 MIL/uL — ABNORMAL LOW (ref 3.87–5.11)
RDW: 14.4 % (ref 11.5–15.5)
WBC: 7.3 K/uL (ref 4.0–10.5)
nRBC: 0 % (ref 0.0–0.2)

## 2024-08-04 LAB — URINALYSIS, ROUTINE W REFLEX MICROSCOPIC
Bilirubin Urine: NEGATIVE
Glucose, UA: NEGATIVE mg/dL
Hgb urine dipstick: NEGATIVE
Ketones, ur: NEGATIVE mg/dL
Leukocytes,Ua: NEGATIVE
Nitrite: NEGATIVE
Protein, ur: NEGATIVE mg/dL
Specific Gravity, Urine: 1.011 (ref 1.005–1.030)
pH: 7 (ref 5.0–8.0)

## 2024-08-04 LAB — COMPREHENSIVE METABOLIC PANEL WITH GFR
ALT: 12 U/L (ref 0–44)
AST: 17 U/L (ref 15–41)
Albumin: 3.1 g/dL — ABNORMAL LOW (ref 3.5–5.0)
Alkaline Phosphatase: 58 U/L (ref 38–126)
Anion gap: 8 (ref 5–15)
BUN: 12 mg/dL (ref 8–23)
CO2: 25 mmol/L (ref 22–32)
Calcium: 8.7 mg/dL — ABNORMAL LOW (ref 8.9–10.3)
Chloride: 103 mmol/L (ref 98–111)
Creatinine, Ser: 0.64 mg/dL (ref 0.44–1.00)
GFR, Estimated: >60 mL/min (ref >60)
Glucose, Bld: 86 mg/dL (ref 70–99)
Potassium: 3.8 mmol/L (ref 3.5–5.1)
Sodium: 136 mmol/L (ref 135–145)
Total Bilirubin: 0.7 mg/dL (ref 0.0–1.2)
Total Protein: 6.2 g/dL — ABNORMAL LOW (ref 6.5–8.1)

## 2024-08-04 LAB — PROTIME-INR
INR: 1.5 — ABNORMAL HIGH (ref 0.8–1.2)
Prothrombin Time: 19.1 s — ABNORMAL HIGH (ref 11.4–15.2)

## 2024-08-04 LAB — POC OCCULT BLOOD, ED: Fecal Occult Blood: NEGATIVE

## 2024-08-04 NOTE — ED Notes (Signed)
Safe transport called at this time. 

## 2024-08-04 NOTE — ED Notes (Signed)
 Pt wheeled to safe transport vehicle. Pt verbalized understanding of discharge instructions.

## 2024-08-04 NOTE — Discharge Instructions (Addendum)
 Your blood work is stable and you did not require any blood transfusion. Your bleeding may be due to hemorrhoids or diverticulosis, but there are other causes we are unable to rule out without additional testing.  I recommend following up with your primary care physician and contacting the [facility] G[facility]ology office to see them within the next week.   Continue taking your Xarelto  and other medications.  Please return to the Emergency Department if your bleeding worsens or you have any pain, fevers, vomiting, chest pain, difficulty breathing, passing out, or dizziness/lightheadedness.

## 2024-08-04 NOTE — ED Provider Notes (Addendum)
 [place] EMERGENCY DEPARTMENT AT [facility] Provider Note   CSN: [id number] Arrival date & time: 08/04/24  1449     Patient presents with: Rectal Bleeding   Gabriella Shaw is a 89 y.o. female.   89 year-old female with history of severe sigmoid diverticulosis, internal hemorrhoids, TIA, A-fib on Xarelto , HTN, hypothyroidism, appendectomy and hysterectomy presenting via EMS from [facility] due to 3-day history of intermittent bright red rectal bleeding, persistent today.  Denies back or abdominal pain but does endorse mild discomfort in rectal area.  Most recent bloody BM today.  She does not believe it is vaginal bleeding or urinary bleeding.  Denies constipation, straining to have a BM.  She was recently admitted in June of this year and during her admission noted to have hematochezia.  GI was consulted and suspected hemorrhoidal bleed, endoscopy was not performed due to patient wishes and suspicion for benign etiology.  However today she reports she does not recall this conversation and would be open to procedures if necessary.  She has not decided on code status and is currently full code.  Denies recent illness/fevers, chest pain, shortness of breath, falls, dizziness, lightheadedness, abdominal pain, diarrhea.  Reports she has been compliant with her Xarelto .   Rectal Bleeding Associated symptoms: no abdominal pain, no dizziness, no fever, no light-headedness and no vomiting        Prior to Admission medications   Medication Sig Start Date End Date Taking? Authorizing Provider  Ascorbic Acid  (VITAMIN C  PO) Take 1 tablet by mouth daily.    [provider]  atorvastatin  (LIPITOR) 40 MG tablet Take 1 tablet (40 mg total) by mouth daily at 6 PM. 09/05/15   Juvenal Raisin U, DO  Cholecalciferol  (VITAMIN D  PO) Take 1 tablet by mouth daily.    [provider]  clonazePAM  (KLONOPIN ) 0.5 MG tablet Take 0.5 mg by mouth at bedtime.    [provider]  Cyanocobalamin  (VITAMIN B 12 PO) Take 1 tablet by mouth daily.    [provider]  Echinacea 125 MG TABS Take 1 capsule by mouth daily.    [provider]  glucosamine-chondroitin 500-400 MG tablet Take 1 tablet by mouth 3 (three) times daily.    [provider]  ipratropium (ATROVENT ) 0.03 % nasal spray Place 2 sprays into both nostrils every 12 (twelve) hours.    [provider]  levothyroxine  (SYNTHROID ) 88 MCG tablet Take 88 mcg by mouth daily.    [provider]  metoprolol  tartrate (LOPRESSOR ) 25 MG tablet Take 25 mg by mouth 2 (two) times daily.    [provider]  omega-3 acid ethyl esters (LOVAZA ) 1 G capsule Take by mouth 2 (two) times daily.    [provider]  Rivaroxaban  (XARELTO ) 15 MG TABS tablet Take 1 tablet (15 mg total) by mouth daily with supper. 09/05/15   Juvenal Raisin PENNER, DO  VITAMIN E PO Take 1 tablet by mouth daily.    [provider]    Allergies: Patient has no known allergies.    Review of Systems  Constitutional:  Negative for fatigue and fever.  Respiratory:  Negative for shortness of breath.   Cardiovascular:  Negative for chest pain and palpitations.  Gastrointestinal:  Positive for blood in stool and hematochezia. Negative for abdominal pain, constipation, diarrhea, nausea and vomiting.  Genitourinary:  Negative for dysuria, hematuria and vaginal bleeding.  Musculoskeletal:  Negative for back pain.  Neurological:  Negative for dizziness, weakness,  light-headedness and numbness.    Updated Vital Signs BP 123/81   Pulse 71   Temp 97.8 F (36.6 C) (Oral)   Resp 15   Ht 5' 7.5 (1.715 m)   Wt 59 kg   SpO2 97%   BMI 20.06 kg/m   Physical Exam Constitutional:      General: She is not in acute distress. HENT:     Head: Normocephalic and atraumatic.  Eyes:     Extraocular Movements: Extraocular movements intact.     Pupils: Pupils are equal, round, and reactive to light.   Cardiovascular:     Rate and Rhythm: Normal rate. Rhythm irregular.     Heart sounds: Normal heart sounds. No murmur heard. Pulmonary:     Effort: Pulmonary effort is normal. No respiratory distress.     Breath sounds: Normal breath sounds.  Abdominal:     General: Abdomen is flat. There is no distension.     Palpations: Abdomen is soft.     Tenderness: There is no abdominal tenderness.  Genitourinary:    Rectum: Guaiac result negative. No tenderness, external hemorrhoid or internal hemorrhoid.     Comments: Underwear with dried bright red blood stain, patient also had piece of tissue paper covering rectum which was also stained with dry bright red blood. However no gross blood appreciated around her anus. No external or internal hemorrhoids noted. No gross blood appreciated in rectal vault. Musculoskeletal:        General: No swelling or deformity.     Right lower leg: No edema.     Left lower leg: No edema.  Neurological:     General: No focal deficit present.     Mental Status: She is alert and oriented to person, place, and time. Mental status is at baseline.     Cranial Nerves: No cranial nerve deficit.     (all labs ordered are listed, but only abnormal results are displayed) Labs Reviewed  COMPREHENSIVE METABOLIC PANEL WITH GFR - Abnormal; Notable for the following components:      Result Value   Calcium  8.7 (*)    Total Protein 6.2 (*)    Albumin 3.1 (*)    All other components within normal limits  CBC WITH DIFFERENTIAL/PLATELET - Abnormal; Notable for the following components:   RBC 3.60 (*)    Hemoglobin 11.5 (*)    HCT 35.9 (*)    Platelets 487 (*)    Monocytes Absolute 1.4 (*)    All other components within normal limits  PROTIME-INR - Abnormal; Notable for the following components:   Prothrombin Time 19.1 (*)    INR 1.5 (*)    All other components within normal limits  POC OCCULT BLOOD, ED - Normal  URINALYSIS, ROUTINE W REFLEX MICROSCOPIC  TYPE AND SCREEN     EKG: None  Radiology: No results found.   Procedures   Medications Ordered in the ED - No data to display                                  Medical Decision Making 89 year-old female with history of severe sigmoid diverticulosis, internal hemorrhoids, TIA, A-fib on Xarelto , HTN, hypothyroidism, appendectomy and hysterectomy presenting via EMS from [facility] due to 3-day history of intermittent mostly painless bright red rectal bleeding in the setting of Xarelto  use.  Ddx includes hemorrhoidal bleed, diverticular bleed, IBD, malignancy, angiodysplasia. Low suspicion for diverticulitis given  lack of abdominal pain or fever.  Reassuringly afebrile, hemodynamically stable. Benign abdominal exam, benign rectal exam with negative hemoccult but had notable dried bright red blood stain on underpants and tissue paper indicative of recent bleed.   Obtain CBC and CMP, coags, T&S, UA  5:05 PM Workup unremarkable, hgb stable from prior. Discussed with patient who does prefer to be discharged rather than admission for observation. Has not had recurrent bleeding while in the ED. She is interested in seeing a GI doctor, has not had one before, does not recall having a colonoscopy before.  Placed consult to GI to arrange follow up and verify no additional workup needed today  5:11 PM Spoke with Dr. Burnette with [facility]. He does recommend to consider holding Xarelto  x 1 week but as long as not having recurrent bleeding ok for outpatient f/u.   5:32 PM Discussed recommendations with patient. Given that she is actively in afib and not having recurrent bleeding or acute anemia, prefer that she continue her Xarelto  for now but arrange close f/u with both GI and PCP for further eval and recommendations.  Continues to not have any recurrent bleeding or pain. Stable for dc Discussed return precautions and follow up recommendations   Amount and/or Complexity of Data Reviewed Labs:  ordered.        Final diagnoses:  Rectal bleeding    ED Discharge Orders     None          Romelle Booty, MD 08/04/24 KATINA Romelle Booty, MD 08/04/24 1733    Pamella Ozell LABOR, DO 08/10/24 1601

## 2024-08-04 NOTE — ED Triage Notes (Signed)
 BIB EMS from [facility], 3 days of on and off rectal bleeding that has continued without stopping today. Pt denies back or abd pain, Mild discomfort in rectal area. 120/56-68-96% RA CBG 104
